# Patient Record
Sex: Male | Born: 1965 | Race: White | Hispanic: No | Marital: Married | Smoking: Current every day smoker
Health system: Southern US, Community
[De-identification: ages and names within clinical notes are randomized; demographics above are authoritative.]

## PROBLEM LIST (undated history)

## (undated) DIAGNOSIS — Z9989 Dependence on other enabling machines and devices: Secondary | ICD-10-CM

## (undated) DIAGNOSIS — G4733 Obstructive sleep apnea (adult) (pediatric): Secondary | ICD-10-CM

## (undated) DIAGNOSIS — K5792 Diverticulitis of intestine, part unspecified, without perforation or abscess without bleeding: Secondary | ICD-10-CM

## (undated) DIAGNOSIS — M2550 Pain in unspecified joint: Secondary | ICD-10-CM

## (undated) DIAGNOSIS — M1990 Unspecified osteoarthritis, unspecified site: Secondary | ICD-10-CM

## (undated) HISTORY — PX: KNEE ARTHROSCOPY: SUR90

## (undated) HISTORY — PX: JOINT REPLACEMENT: SHX530

## (undated) HISTORY — PX: APPENDECTOMY: SHX54

---

## 2006-10-22 ENCOUNTER — Ambulatory Visit: Payer: Self-pay | Admitting: Orthopedic Surgery

## 2011-09-05 ENCOUNTER — Ambulatory Visit (INDEPENDENT_AMBULATORY_CARE_PROVIDER_SITE_OTHER): Payer: BC Managed Care – PPO

## 2011-09-05 ENCOUNTER — Other Ambulatory Visit: Payer: Self-pay | Admitting: Orthopedic Surgery

## 2011-09-05 ENCOUNTER — Encounter: Payer: Self-pay | Admitting: Orthopedic Surgery

## 2011-09-05 ENCOUNTER — Ambulatory Visit (INDEPENDENT_AMBULATORY_CARE_PROVIDER_SITE_OTHER): Payer: BC Managed Care – PPO | Admitting: Orthopedic Surgery

## 2011-09-05 VITALS — Ht 77.0 in | Wt 312.0 lb

## 2011-09-05 DIAGNOSIS — M25469 Effusion, unspecified knee: Secondary | ICD-10-CM

## 2011-09-05 DIAGNOSIS — M25569 Pain in unspecified knee: Secondary | ICD-10-CM

## 2011-09-05 DIAGNOSIS — M23329 Other meniscus derangements, posterior horn of medial meniscus, unspecified knee: Secondary | ICD-10-CM

## 2011-09-05 NOTE — Progress Notes (Signed)
Subjective:    Patient ID: Paul Wright, male    DOB: 1965/12/17, 46 y.o.   MRN: [id number]  Knee Pain  The incident occurred more than 1 week ago (2 weeks ). The incident occurred at home. There was no injury mechanism. The pain is present in the right knee. The quality of the pain is described as aching. The pain is at a severity of 7/10. The pain is moderate. The pain has been constant since onset. Associated symptoms include a loss of motion and muscle weakness. Pertinent negatives include no inability to bear weight, loss of sensation, numbness or tingling.      Review of Systems  Neurological: Negative for tingling and numbness.  All other systems reviewed and are negative.       Objective:   Physical Exam  Constitutional: He is oriented to person, place, and time. He appears well-developed and well-nourished.  Cardiovascular: Intact distal pulses.   Abdominal: He exhibits no distension.  Musculoskeletal:       Right knee: He exhibits effusion.  Lymphadenopathy:    He has no cervical adenopathy.  Neurological: He is alert and oriented to person, place, and time. He has normal reflexes. He displays normal reflexes. He exhibits normal muscle tone. Coordination normal.  Skin: Skin is warm and dry. No rash noted. No erythema. No pallor.  Psychiatric: He has a normal mood and affect. His behavior is normal. Judgment and thought content normal.   Right Knee Exam   Tenderness  The patient is experiencing tenderness in the medial joint line.  Range of Motion  Extension: normal  Flexion: 120   Muscle Strength   The patient has normal right knee strength.  Tests  McMurray:  Medial - positive Lateral - negative Lachman:  Anterior - negative    Posterior - negative Drawer:       Anterior - negative    Posterior - negative Varus: negative Valgus: negative Pivot Shift: negative  Other  Erythema: absent Scars: absent Sensation: normal Pulse: present Swelling:  moderate Other tests: effusion present   Left Knee Exam  Left knee exam is normal.  Tenderness  The patient is experiencing no tenderness.     Range of Motion  The patient has normal left knee ROM.  Tests  McMurray:  Medial - negative Lateral - negative Lachman:  Anterior - negative    Posterior - negative Drawer:       Anterior - negative     Posterior - negative Varus: negative Valgus: negative Pivot Shift: positive  Other  Erythema: absent Scars: present Sensation: normal Pulse: present Swelling: none   Right Hip Exam  Right hip exam is normal.   Range of Motion  The patient has normal right hip ROM.  Muscle Strength  The patient has normal right hip strength.  Other  Scars: absent Sensation: normal Pulse: present   Left Hip Exam  Left hip exam is normal.  Range of Motion  The patient has normal left hip ROM.  Muscle Strength  The patient has normal left hip strength.   Other  Scars: absent Sensation: normal Pulse: present     X-rays of the RIGHT knee show joint effusion And he has symmetric joint space narrowing on each side of the joint       Assessment & Plan:  Medial meniscal tear.  Joint effusion.  Aspiration injection RIGHT knee.  Continue oral anti-inflammatories and ice for 2 weeks if not improved, called the office and we will  schedule an MRI of the RIGHT knee for medial meniscal tear  Aspiration RIGHT knee Verbal consent and timeout were completed for a RIGHT knee aspiration  Under sterile conditions the RIGHT knee was aspirated from a lateral approach.  Findings were clear yellow fluid 50 cc  A sterile dressing was applied there were no complications  Knee  Injection Procedure Note  Pre-operative Diagnosis: right knee oa  Post-operative Diagnosis: same  Indications: pain  Anesthesia: ethyl chloride   Procedure Details   Verbal consent was obtained for the procedure. Time out was completed.The joint was  prepped with alcohol, followed by  Ethyl chloride spray and A 20 gauge needle was inserted into the knee via lateral approach; 4ml 1% lidocaine and 1 ml of depomedrol  was then injected into the joint . The needle was removed and the area cleansed and dressed.  Complications:  None; patient tolerated the procedure well.

## 2011-09-05 NOTE — Patient Instructions (Addendum)
You have received a steroid shot. 15% of patients experience increased pain at the injection site with in the next 24 hours. This is best treated with ice and tylenol extra strength 2 tabs every 8 hours. If you are still having pain please call the office.   Continue ice and diclofenac  Call us in 2 weeks if not better and we will order MRI right knee Knee, Cartilage (Meniscus) Injury It is suspected that you have a torn cartilage (meniscus) in your knee. The menisci are made of tough cartilage, and fit between the surfaces of the thigh and leg bones. The menisci are "C"shaped and have a wedged profile. The wedged profile helps the stability of the joint by keeping the rounded femur surface from sliding off the flat tibial surface. The menisci are fed (nourished) by small blood vessels; but there is also a large area at the inner edge of the meniscus that does not have a good blood supply (avascular). This presents a problem when there is an injury to the meniscus, because areas without good blood supply heal poorly. As a result when there is a torn cartilage in the knee, surgery is often required to fix it. This is usually done with a surgical procedure less invasive than open surgery (arthroscopy). Some times open surgery of the knee is required if there is other damage. PURPOSE OF THE MENISCUS The medial meniscus rests on the medial tibial plateau. The tibia is the large bone in your lower leg (the shin bone). The medial tibial plateau is the upper end of the bone making up the inner part of your knee. The lateral meniscus serves the same purpose and is located on the outside of the knee. The menisci help to distribute your body weight across the knee joint; they act as shock absorbers. Without the meniscus present, the weight of your body would be unevenly applied to the bones in your legs (the femur and tibia). The femur is the large bone in your thigh. This uneven weight distribution would cause  increased wear and tear on the cartilage lining the joint surfaces, leading to early damage (arthritis) of these areas. The presence of the menisci cartilage is necessary for a healthy knee. PURPOSE OF THE KNEE CARTILAGE The knee joint is made up of three bones: the thigh bone (femur), the shin bone (tibia), and the knee cap (patella). The surfaces of these bones at the knee joint are covered with cartilage called articular cartilage. This smooth slippery surface allows the bones to slide against each other without causing bone damage. The meniscus sits between these cartilaginous surfaces of the bones. It distributes the weight evenly in the joints and helps with the stability of the joint (keeps the joint steady). HOME CARE INSTRUCTIONS  Use crutches and external braces as instructed.   Once home an ice pack applied to your injured knee may help with discomfort and keep the swelling down. An ice pack can be used for the first couple of days or as instructed.   Only take over-the-counter or prescription medicines for pain, discomfort, or fever as directed by your caregiver.   Call if you do not have relief of pain with medications or if there is increasing in pain.   Call if your foot becomes cold or blue.   You may resume normal diet and activities as directed.   Make sure to keep your appointment with your follow-up caregiver. This injury may require further evaluation and treatment beyond the temporary  treatment given today.  Document Released: 04/27/2002 Document Revised: 01/24/2011 Document Reviewed: 08/19/2008 [facility] Patient Information 2012 [place], [place].Meniscus Injury of the Knee, Arthroscopy You may have an internal derangement of the knee. This means something is wrong inside the knee. Your caregiver can make a more accurate diagnosis (learning what is wrong) by performing an arthroscopic procedure. Your knee has two layers of cartilage. Articular cartilage covers the bone ends.  It lets your knee bend and move smoothly. Two menisci (thick pads of cartilage that form a rim inside the joint) help absorb shock. They stabilize your knee. Ligaments bind the bones together. They support your knee joint. Muscles move the joint, help support your knee, and take stress off the joint itself.   ABOUT THE PROCEDURE Arthroscopy is a surgical technique. It allows your orthopedic surgeon to diagnose and treat your knee injury with accuracy. The surgeon looks into your knee through a small scope. The scope is like a small (pencil-sized) telescope. Arthroscopy is less invasive than open knee surgery. You can expect a more rapid recovery. Following your caregiver's instructions will help you recover rapidly and completely. Use crutches, rest, elevate, ice, and do knee exercises as instructed. The length of recovery depends on various factors. These factors include type of injury, age, physical condition, medical conditions, and your determination. How long you will be away from your normal activities will depend on what kind of knee problem you have. It will also depend on how much tissue is damaged. Rebuilding your muscles after arthroscopy helps ensure a full recovery. RECOVERY Recovery after a meniscus injury depends on how much meniscus is damaged. It also depends on whether or not you have damaged other knee tissue. With small tears, your recovery may take a couple weeks. Larger tears will take longer. Meniscus injuries can usually be treated during arthroscopy. If your injury is on the inner edge of the meniscus, your surgeon may trim the meniscus back to a smooth rim. In other cases, your surgeon will try to repair a damaged meniscus with sutures (stitches). This may lengthen your rehabilitation. It may provide better long-term health by helping your knee retain its shock absorption abilities. Use crutches, limit weight bearing, rest, elevate, apply ice, and exercise your knee as instructed. If a  brace is applied, use as directed. The length of recovery depends on various factors including type of injury, age, physical condition, other medical conditions, and your determination. Your caregiver will help with instructions for rehabilitation of your knee. HOME CARE INSTRUCTIONS  Use crutches and knee exercises as instructed.   Applying an ice pack to your operative site may help with discomfort. It may also keep the swelling down.   Only take over-the-counter or prescription medicines for pain, discomfort, inflammation (soreness)or fever as directed by your caregiver. You may use these only if your caregiver has not given medications that would interfere.   You may resume normal diet and activities as directed.  SEEK MEDICAL ATTENTION IF:  There is increased bleeding (more than a small spot) from the wound.   You notice redness, swelling, or increasing pain in the wound.   Pus is coming from wound.   An unexplained oral temperature above 102 F (38.9 C) develops, or as your caregiver suggests.   You notice a foul smell coming from the wound or dressing.  SEEK IMMEDIATE MEDICAL CARE IF:  You develop a rash.   You have difficulty breathing.   You have any allergic problems.  Document Released: 02/02/2000  Document Revised: 01/24/2011 Document Reviewed: 04/20/2007 [facility] Patient Information 2012 [place], [place].

## 2011-11-18 ENCOUNTER — Telehealth: Payer: Self-pay | Admitting: Orthopedic Surgery

## 2011-11-18 NOTE — Telephone Encounter (Signed)
Patient's wife Selena Batten called to request a follow up appointment for right knee fluid, states increasing, as per previous office visit and treatment 09/05/11.  Office note states that if not resolved, patient to call, and to have MRI.  Please advise.  Patient's phone # [phone] [facility]).  I left a message for patient at cell # to relay this information, and for him to call us so that we may re-explain about MRI first, before we schedule another office appointment.

## 2011-11-18 NOTE — Telephone Encounter (Signed)
Per nurse, as per Dr. Romeo Apple, okay to schedule appointment in office.  Done, patient aware.

## 2011-11-20 ENCOUNTER — Encounter: Payer: Self-pay | Admitting: Orthopedic Surgery

## 2011-11-20 ENCOUNTER — Ambulatory Visit (INDEPENDENT_AMBULATORY_CARE_PROVIDER_SITE_OTHER): Payer: BC Managed Care – PPO | Admitting: Orthopedic Surgery

## 2011-11-20 VITALS — BP 102/70 | Ht 77.0 in | Wt 282.0 lb

## 2011-11-20 DIAGNOSIS — M25469 Effusion, unspecified knee: Secondary | ICD-10-CM

## 2011-11-20 DIAGNOSIS — M23329 Other meniscus derangements, posterior horn of medial meniscus, unspecified knee: Secondary | ICD-10-CM

## 2011-11-20 NOTE — Patient Instructions (Addendum)
You have received a steroid shot. 15% of patients experience increased pain at the injection site with in the next 24 hours. This is best treated with ice and tylenol extra strength 2 tabs every 8 hours. If you are still having pain please call the office.  Knee Effusion The medical term for having fluid in your knee is effusion. This is often due to an internal derangement of the knee. This means something is wrong inside the knee. Some of the causes of fluid in the knee may be torn cartilage, a torn ligament, or bleeding into the joint from an injury. Your knee is likely more difficult to bend and move. This is often because there is increased pain and pressure in the joint. The time it takes for recovery from a knee effusion depends on different factors, including:    Type of injury.   Your age.   Physical and medical conditions.   Rehabilitation Strategies.  How long you will be away from your normal activities will depend on what kind of knee problem you have and how much damage is present. Your knee has two types of cartilage. Articular cartilage covers the bone ends and lets your knee bend and move smoothly. Two menisci, thick pads of cartilage that form a rim inside the joint, help absorb shock and stabilize your knee. Ligaments bind the bones together and support your knee joint. Muscles move the joint, help support your knee, and take stress off the joint itself. CAUSES   Often an effusion in the knee is caused by an injury to one of the menisci. This is often a tear in the cartilage. Recovery after a meniscus injury depends on how much meniscus is damaged and whether you have damaged other knee tissue. Small tears may heal on their own with conservative treatment. Conservative means rest, limited weight bearing activity and muscle strengthening exercises. Your recovery may take up to 6 weeks.   TREATMENT   Larger tears may require surgery. Meniscus injuries may be treated during  arthroscopy. Arthroscopy is a procedure in which your surgeon uses a small telescope like instrument to look in your knee. Your caregiver can make a more accurate diagnosis (learning what is wrong) by performing an arthroscopic procedure. If your injury is on the inner margin of the meniscus, your surgeon may trim the meniscus back to a smooth rim. In other cases your surgeon will try to repair a damaged meniscus with stitches (sutures). This may make rehabilitation take longer, but may provide better long term result by helping your knee keep its shock absorption capabilities. Ligaments which are completely torn usually require surgery for repair. HOME CARE INSTRUCTIONS  Use crutches as instructed.   If a brace is applied, use as directed.   Once you are home, an ice pack applied to your swollen knee may help with discomfort and help decrease swelling.   Keep your knee raised (elevated) when you are not up and around or on crutches.   Only take over-the-counter or prescription medicines for pain, discomfort, or fever as directed by your caregiver.   Your caregivers will help with instructions for rehabilitation of your knee. This often includes strengthening exercises.   You may resume a normal diet and activities as directed.  SEEK MEDICAL CARE IF:    There is increased swelling in your knee.   You notice redness, swelling, or increasing pain in your knee.   An unexplained oral temperature above 102 F (38.9 C) develops.  SEEK  IMMEDIATE MEDICAL CARE IF:    You develop a rash.   You have difficulty breathing.   You have any allergic reactions from medications you may have been given.   There is severe pain with any motion of the knee.  MAKE SURE YOU:    Understand these instructions.   Will watch your condition.   Will get help right away if you are not doing well or get worse.  Document Released: 04/27/2003 Document Revised: 04/29/2011 Document Reviewed:  07/01/2007 [facility] Patient Information 2013 [place], [place].

## 2011-11-20 NOTE — Progress Notes (Signed)
Patient ID: Paul Wright, male   DOB: May 17, 1965, 46 y.o.   MRN: [id number] Previous diagnosis and treatment  Medial meniscal tear.  Joint effusion.  Aspiration injection RIGHT knee.  Continue oral anti-inflammatories and ice for 2 weeks if not improved, called the office and we will schedule an MRI of the RIGHT knee for medial meniscal tear  Chief Complaint  Patient presents with  . Knee Problem    reoccuring right knee swelling    Review of systems no catching locking or giving way but recurrent fluid  Ambulation is with a slight limp swollen joint flexion ARC 125 ligaments are stable strength is normal scans intact pulses good   assessment established problem worse Joint effusion possible meniscus tear  Aspirate inject aspirated 50 cc of yellow fluid  Continue ice I recommend arthroscopic surgery when the patient can be off of work for 4 weeks because his job is intense in terms of stress on the knee he will think about it  Aspiration RIGHT knee Verbal consent and timeout were completed for a RIGHT knee aspiration  Under sterile conditions the RIGHT knee was aspirated from a lateral approach. Followed by injection of 40 mg of Depo-Medrol with 3 cc 1% lidocaine  Findings were clear yellow fluid 50 cc  A sterile dressing was applied there were no complications

## 2012-08-27 ENCOUNTER — Encounter: Payer: Self-pay | Admitting: Orthopedic Surgery

## 2012-08-27 ENCOUNTER — Ambulatory Visit (INDEPENDENT_AMBULATORY_CARE_PROVIDER_SITE_OTHER): Payer: BC Managed Care – PPO

## 2012-08-27 ENCOUNTER — Ambulatory Visit (INDEPENDENT_AMBULATORY_CARE_PROVIDER_SITE_OTHER): Payer: BC Managed Care – PPO | Admitting: Orthopedic Surgery

## 2012-08-27 VITALS — BP 121/86 | Ht 77.0 in | Wt 279.0 lb

## 2012-08-27 DIAGNOSIS — M1710 Unilateral primary osteoarthritis, unspecified knee: Secondary | ICD-10-CM

## 2012-08-27 DIAGNOSIS — M25562 Pain in left knee: Secondary | ICD-10-CM

## 2012-08-27 DIAGNOSIS — M25569 Pain in unspecified knee: Secondary | ICD-10-CM

## 2012-08-27 DIAGNOSIS — M179 Osteoarthritis of knee, unspecified: Secondary | ICD-10-CM | POA: Insufficient documentation

## 2012-08-27 MED ORDER — NABUMETONE 500 MG PO TABS: 500.0000 mg | ORAL_TABLET | Freq: Two times a day (BID) | ORAL | Status: DC

## 2012-08-27 NOTE — Patient Instructions (Addendum)
Pick up medication from Pharmacy

## 2012-08-27 NOTE — Progress Notes (Signed)
Patient ID: Paul Wright, male   DOB: 1965/05/24, 47 y.o.   MRN: [id number] Chief complaint pain left knee medial side for 55 months  47 year old male with history of osteophytes knee effusion right knee presents with pain in his left knee over the medial joint line with catching locking swelling. Sharp dull pain reaches 8-9/10 and has become constant. 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  Review of systems negative except for snoring and joint pain with swelling stiffness muscle pain as described. All other systems reviewed and negative. Past Medical History  Diagnosis Date  . Sleep apnea    Past Surgical History  Procedure Laterality Date  . Knee arthroscopy     Family History  Problem Relation Age of Onset  . Arthritis      BP 121/86  Ht 6\' 5"  (1.956 m)  Wt 279 lb (126.554 kg)  BMI 33.08 kg/m2 General appearance is normal, the patient is alert and oriented x3 with normal mood and affect. He is walking just fine no assistive device no brace is. Left knee joint effusion mild trace, flexion 130 stability tests were normal strength was normal skin was normal pulse was normal sensory exam normal  X-ray medial joint space narrowing osteoarthritis  Impression left knee pain with osteoarthritis  Inject left knee start Relafen twice a day followup 6 months  Knee  Injection Procedure Note  Pre-operative Diagnosis: left knee oa  Post-operative Diagnosis: same  Indications: pain  Anesthesia: ethyl chloride   Procedure Details   Verbal consent was obtained for the procedure. Time out was completed.The joint was prepped with alcohol, followed by  Ethyl chloride spray and A 20 gauge needle was inserted into the knee via lateral approach; 4ml 1% lidocaine and 1 ml of depomedrol  was then injected into the joint . The needle was removed and the area cleansed and dressed.  Complications:  None; patient tolerated the procedure well.

## 2013-03-02 ENCOUNTER — Ambulatory Visit (INDEPENDENT_AMBULATORY_CARE_PROVIDER_SITE_OTHER): Payer: BC Managed Care – PPO | Admitting: Orthopedic Surgery

## 2013-03-02 ENCOUNTER — Encounter: Payer: Self-pay | Admitting: Orthopedic Surgery

## 2013-03-02 VITALS — BP 132/68 | Ht 77.0 in | Wt 279.0 lb

## 2013-03-02 DIAGNOSIS — IMO0002 Reserved for concepts with insufficient information to code with codable children: Secondary | ICD-10-CM

## 2013-03-02 DIAGNOSIS — M1710 Unilateral primary osteoarthritis, unspecified knee: Secondary | ICD-10-CM

## 2013-03-02 DIAGNOSIS — M179 Osteoarthritis of knee, unspecified: Secondary | ICD-10-CM

## 2013-03-02 DIAGNOSIS — M25569 Pain in unspecified knee: Secondary | ICD-10-CM

## 2013-03-02 DIAGNOSIS — M25562 Pain in left knee: Secondary | ICD-10-CM

## 2013-03-02 NOTE — Patient Instructions (Signed)
You have received a steroid shot. 15% of patients experience increased pain at the injection site with in the next 24 hours. This is best treated with ice and tylenol extra strength 2 tabs every 8 hours. If you are still having pain please call the office.    

## 2013-03-02 NOTE — Progress Notes (Signed)
Patient ID: Paul Wright, male   DOB: 1965-08-23, 48 y.o.   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 He had an injection last time  Is not having any catching or locking or giving way just medial knee pain with some swelling intermittently depending on his activities  The knee has trace effusion he is maintained his motion his knee is stable strength is normal scans intact good pulse normal sensation ambulating without labor gait.  Impression osteoarthritis left knee  Repeat injection  Followup 6 months Knee  Injection Procedure Note  Pre-operative Diagnosis: left knee oa  Post-operative Diagnosis: same  Indications: pain  Anesthesia: ethyl chloride   Procedure Details   Verbal consent was obtained for the procedure. Time out was completed.The joint was prepped with alcohol, followed by  Ethyl chloride spray and A 20 gauge needle was inserted into the knee via lateral approach; 4ml 1% lidocaine and 1 ml of depomedrol  was then injected into the joint . The needle was removed and the area cleansed and dressed.  Complications:  None; patient tolerated the procedure well.

## 2013-08-31 ENCOUNTER — Ambulatory Visit: Payer: BC Managed Care – PPO | Admitting: Orthopedic Surgery

## 2013-09-23 ENCOUNTER — Ambulatory Visit (INDEPENDENT_AMBULATORY_CARE_PROVIDER_SITE_OTHER): Payer: BC Managed Care – PPO | Admitting: Orthopedic Surgery

## 2013-09-23 VITALS — BP 131/84 | Ht 77.0 in | Wt 279.0 lb

## 2013-09-23 DIAGNOSIS — M1710 Unilateral primary osteoarthritis, unspecified knee: Secondary | ICD-10-CM

## 2013-09-23 DIAGNOSIS — M25562 Pain in left knee: Secondary | ICD-10-CM

## 2013-09-23 DIAGNOSIS — M1712 Unilateral primary osteoarthritis, left knee: Secondary | ICD-10-CM

## 2013-09-23 DIAGNOSIS — M25569 Pain in unspecified knee: Secondary | ICD-10-CM

## 2013-09-23 MED ORDER — NABUMETONE 500 MG PO TABS
500.0000 mg | ORAL_TABLET | Freq: Two times a day (BID) | ORAL | Status: DC
Start: 2013-09-23 — End: 2016-06-18

## 2013-09-23 NOTE — Patient Instructions (Signed)
Joint Injection  Care After  Refer to this sheet in the next few days. These instructions provide you with information on caring for yourself after you have had a joint injection. Your caregiver also may give you more specific instructions. Your treatment has been planned according to current medical practices, but problems sometimes occur. Call your caregiver if you have any problems or questions after your procedure.  After any type of joint injection, it is not uncommon to experience:  · Soreness, swelling, or bruising around the injection site.  · Mild numbness, tingling, or weakness around the injection site caused by the numbing medicine used before or with the injection.  It also is possible to experience the following effects associated with the specific agent after injection:  · Iodine-based contrast agents:  ¨ Allergic reaction (itching, hives, widespread redness, and swelling beyond the injection site).  · Corticosteroids (These effects are rare.):  ¨ Allergic reaction.  ¨ Increased blood sugar levels (If you have diabetes and you notice that your blood sugar levels have increased, notify your caregiver).  ¨ Increased blood pressure levels.  ¨ Mood swings.  · Hyaluronic acid in the use of viscosupplementation.  ¨ Temporary heat or redness.  ¨ Temporary rash and itching.  ¨ Increased fluid accumulation in the injected joint.  These effects all should resolve within a day after your procedure.   HOME CARE INSTRUCTIONS  · Limit yourself to light activity the day of your procedure. Avoid lifting heavy objects, bending, stooping, or twisting.  · Take prescription or over-the-counter pain medication as directed by your caregiver.  · You may apply ice to your injection site to reduce pain and swelling the day of your procedure. Ice may be applied 03-04 times:  ¨ Put ice in a plastic bag.  ¨ Place a towel between your skin and the bag.  ¨ Leave the ice on for no longer than 15-20 minutes each time.  SEEK  IMMEDIATE MEDICAL CARE IF:   · Pain and swelling get worse rather than better or extend beyond the injection site.  · Numbness does not go away.  · Blood or fluid continues to leak from the injection site.  · You have chest pain.  · You have swelling of your face or tongue.  · You have trouble breathing or you become dizzy.  · You develop a fever, chills, or severe tenderness at the injection site that last longer than 1 day.  MAKE SURE YOU:  · Understand these instructions.  · Watch your condition.  · Get help right away if you are not doing well or if you get worse.  Document Released: 10/18/2010 Document Revised: 04/29/2011 Document Reviewed: 10/18/2010  [employer]® Patient Information ©2015 [employer]. This information is not intended to replace advice given to you by your health care provider. Make sure you discuss any questions you have with your health care provider.

## 2013-09-23 NOTE — Progress Notes (Signed)
Chief Complaint  Patient presents with  . Follow-up    recheck left knee    6 followup left knee. Patient claims of pain mild swelling some stiffness no catching locking or giving way  Review of systems negative  General appearance is normal, the patient is alert and oriented x3 with normal mood and affect. BP 131/84  Ht 6\' 5"  (1.956 m)  Wt 279 lb (126.554 kg)  BMI 33.08 kg/m2 Neurovascular exam remains intact he has medial joint line tenderness no effusion full range of motion and is stable  Repeat injection continue nabumetone followup 6 months  Knee  Injection Procedure Note  Pre-operative Diagnosis: left knee oa  Post-operative Diagnosis: same  Indications: pain  Anesthesia: ethyl chloride   Procedure Details   Verbal consent was obtained for the procedure. Time out was completed.The joint was prepped with alcohol, followed by  Ethyl chloride spray and A 20 gauge needle was inserted into the knee via lateral approach; 4ml 1% lidocaine and 1 ml of depomedrol  was then injected into the joint . The needle was removed and the area cleansed and dressed.  Complications:  None; patient tolerated the procedure well.

## 2013-11-12 ENCOUNTER — Encounter: Payer: Self-pay | Admitting: Family Medicine

## 2013-11-12 ENCOUNTER — Encounter (INDEPENDENT_AMBULATORY_CARE_PROVIDER_SITE_OTHER): Payer: Self-pay

## 2013-11-12 ENCOUNTER — Ambulatory Visit (INDEPENDENT_AMBULATORY_CARE_PROVIDER_SITE_OTHER): Payer: BC Managed Care – PPO | Admitting: Family Medicine

## 2013-11-12 VITALS — BP 115/62 | HR 70 | Temp 98.6°F | Ht 77.0 in | Wt 284.0 lb

## 2013-11-12 DIAGNOSIS — Z0000 Encounter for general adult medical examination without abnormal findings: Secondary | ICD-10-CM

## 2013-11-12 DIAGNOSIS — G4733 Obstructive sleep apnea (adult) (pediatric): Secondary | ICD-10-CM

## 2013-11-12 DIAGNOSIS — G609 Hereditary and idiopathic neuropathy, unspecified: Secondary | ICD-10-CM | POA: Insufficient documentation

## 2013-11-12 MED ORDER — SILDENAFIL CITRATE 20 MG PO TABS: 20.0000 mg | ORAL_TABLET | Freq: Three times a day (TID) | ORAL | Status: DC

## 2013-11-12 MED ORDER — GABAPENTIN 300 MG PO CAPS: ORAL_CAPSULE | ORAL | Status: DC

## 2013-11-12 NOTE — Progress Notes (Signed)
   Subjective:    Patient ID: Paul Wright, male    DOB: August 31, 1965, 48 y.o.   MRN: [id number]  HPI  47 yr old  With several as of yet undiagnosed problems, but which seem to be lurking beneath the surface.  Smoker with very positive FH of early heart disease. Has knee problems, followed by ortho.  Sleep apnea but unable to wear CPAP.  Burning and numbness in feet.  Polyuria and -dipsia.  Erectile dysfunction; had tried testosterone without much success.    Review of Systems  Constitutional: Negative.   HENT: Negative.   Eyes: Negative.   Respiratory: Negative.   Cardiovascular: Negative.   Gastrointestinal: Negative.   Endocrine: Positive for polydipsia and polyphagia.  Genitourinary:       Erectile dysfunction  Musculoskeletal: Positive for arthralgias.  Skin: Negative.   Neurological: Negative.   Hematological: Negative.   Psychiatric/Behavioral: Negative.        Objective:   Physical Exam  Constitutional: He is oriented to person, place, and time. He appears well-developed and well-nourished.  HENT:  Head: Normocephalic.  Right Ear: External ear normal.  Left Ear: External ear normal.  Nose: Nose normal.  Mouth/Throat: Oropharynx is clear and moist.  Eyes: Conjunctivae and EOM are normal. Pupils are equal, round, and reactive to light.  Neck: Normal range of motion. Neck supple.  Cardiovascular: Normal rate, regular rhythm, normal heart sounds and intact distal pulses.   Pulmonary/Chest: Effort normal and breath sounds normal.  Abdominal: Soft. Bowel sounds are normal.  Musculoskeletal: Normal range of motion.  Neurological: He is alert and oriented to person, place, and time.  Skin: Skin is warm and dry.  Psychiatric: He has a normal mood and affect. His behavior is normal. Judgment and thought content normal.    BP 115/62  Pulse 70  Temp(Src) 98.6 F (37 C) (Oral)  Ht  (1.956 m)  Wt 284 lb (128.822 kg)  BMI 33.67 kg/m2      Assessment & Plan:  1.  Unspecified hereditary and idiopathic peripheral neuropathy Suspect may have undiagnosed DM  Will begin neurontin  2. Obstructive sleep apnea To get back to respiratory therapy to try nasal pillow  3.Polyuria and dipsia Labs to R/O DM  4.family history early heart disease Check lipids and encourage smoking cessation  5.Erectile dysfunction Trial of Viagra  Frederica Kuster MD

## 2013-11-12 NOTE — Progress Notes (Signed)
   Subjective:    Patient ID: Paul Wright, male    DOB: 1965/10/29, 48 y.o.   MRN: [id number]  HPI    Review of Systems     Objective:   Physical Exam  BP 115/62  Pulse 70  Temp(Src) 98.6 F (37 C) (Oral)  Ht  (1.956 m)  Wt 284 lb (128.822 kg)  BMI 33.67 kg/m2      Assessment & Plan:

## 2013-11-19 ENCOUNTER — Other Ambulatory Visit: Payer: BC Managed Care – PPO

## 2013-11-20 LAB — LIPID PANEL
CHOLESTEROL TOTAL: 155 mg/dL (ref 100–199)
Chol/HDL Ratio: 7.8 ratio units — ABNORMAL HIGH (ref 0.0–5.0)
HDL: 20 mg/dL — ABNORMAL LOW (ref >39)
LDL CALC: 58 mg/dL (ref 0–99)
Triglycerides: 384 mg/dL — ABNORMAL HIGH (ref 0–149)
VLDL Cholesterol Cal: 77 mg/dL — ABNORMAL HIGH (ref 5–40)

## 2013-11-20 LAB — CMP14+EGFR
ALK PHOS: 90 IU/L (ref 39–117)
ALT: 32 IU/L (ref 0–44)
AST: 25 IU/L (ref 0–40)
Albumin/Globulin Ratio: 2.1 (ref 1.1–2.5)
Albumin: 4.6 g/dL (ref 3.5–5.5)
BILIRUBIN TOTAL: 0.6 mg/dL (ref 0.0–1.2)
BUN/Creatinine Ratio: 14 (ref 9–20)
BUN: 11 mg/dL (ref 6–24)
CALCIUM: 9.2 mg/dL (ref 8.7–10.2)
CO2: 24 mmol/L (ref 18–29)
CREATININE: 0.81 mg/dL (ref 0.76–1.27)
Chloride: 102 mmol/L (ref 97–108)
GFR calc Af Amer: 121 mL/min/{1.73_m2} (ref >59)
GFR, EST NON AFRICAN AMERICAN: 105 mL/min/{1.73_m2} (ref >59)
GLUCOSE: 89 mg/dL (ref 65–99)
Globulin, Total: 2.2 g/dL (ref 1.5–4.5)
Potassium: 4.4 mmol/L (ref 3.5–5.2)
SODIUM: 140 mmol/L (ref 134–144)
Total Protein: 6.8 g/dL (ref 6.0–8.5)

## 2013-12-03 ENCOUNTER — Telehealth: Payer: Self-pay | Admitting: Family Medicine

## 2013-12-03 NOTE — Telephone Encounter (Signed)
Message copied by Doreatha MassedMOORE, MITZI on Fri Dec 03, 2013 12:34 PM ------      Message from: Frederica KusterMILLER, STEPHEN M      Created: Wed Nov 24, 2013  7:45 AM       Review of lab results showed normal chemistry panel; there is no evidence of diabetes and lipid panel isn't surprisingly pretty good. His total cholesterol is normal but his triglycerides and HDL, good cholesterol is low. However his LDL cholesterol the most important one is normal. ------

## 2013-12-09 ENCOUNTER — Encounter: Payer: Self-pay | Admitting: *Deleted

## 2013-12-13 NOTE — Telephone Encounter (Signed)
Patient aware.

## 2014-03-29 ENCOUNTER — Ambulatory Visit: Payer: Self-pay | Admitting: Orthopedic Surgery

## 2014-03-29 ENCOUNTER — Encounter: Payer: Self-pay | Admitting: Orthopedic Surgery

## 2014-05-09 ENCOUNTER — Ambulatory Visit (INDEPENDENT_AMBULATORY_CARE_PROVIDER_SITE_OTHER): Payer: BLUE CROSS/BLUE SHIELD | Admitting: Orthopedic Surgery

## 2014-05-09 ENCOUNTER — Encounter: Payer: Self-pay | Admitting: Orthopedic Surgery

## 2014-05-09 ENCOUNTER — Ambulatory Visit (INDEPENDENT_AMBULATORY_CARE_PROVIDER_SITE_OTHER): Payer: BLUE CROSS/BLUE SHIELD

## 2014-05-09 VITALS — Resp 16 | Ht 77.0 in | Wt 309.0 lb

## 2014-05-09 DIAGNOSIS — M7552 Bursitis of left shoulder: Secondary | ICD-10-CM | POA: Diagnosis not present

## 2014-05-09 DIAGNOSIS — M7711 Lateral epicondylitis, right elbow: Secondary | ICD-10-CM

## 2014-05-09 DIAGNOSIS — M25512 Pain in left shoulder: Secondary | ICD-10-CM

## 2014-05-09 MED ORDER — TRAMADOL-ACETAMINOPHEN 37.5-325 MG PO TABS: 1.0000 | ORAL_TABLET | ORAL | Status: DC | PRN

## 2014-05-09 NOTE — Progress Notes (Signed)
Patient ID: Paul Wright, male   DOB: 1965-06-19, 50 y.o.   MRN: [id number]  Chief Complaint  Patient presents with  . Shoulder Pain    left shoulder pain, no known injury     Paul Wright is a 49 y.o. male.   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 He gives a history of  Shoulder dislocation of the child and then about 12 years ago had a shoulder injection for what sounds like impingement syndrome Review of Systems Stiff joints muscle weakness limb pain.  All other systems were reviewed and are normal  Past Medical History  Diagnosis Date  . Sleep apnea   . Knee pain     Past Surgical History  Procedure Laterality Date  . Knee arthroscopy      Family History  Problem Relation Age of Onset  . Arthritis    . Heart disease Mother   . Diabetes Mother   . Heart attack Father   . Graves' disease Sister   . Hypothyroidism Brother   . Hypertension Maternal Aunt   . Diabetes Sister   . Heart disease Sister   . Heart disease Sister   . Heart disease Brother   . Diabetes Brother     Social History History  Substance Use Topics  . Smoking status: Current Every Day Smoker -- 0.75 packs/day  . Smokeless tobacco: Never Used  . Alcohol Use: Yes     Comment: rare    No Known Allergies  Current Outpatient Prescriptions  Medication Sig Dispense Refill  . nabumetone (RELAFEN) 500 MG tablet Take 1 tablet (500 mg total) by mouth 2 (two) times daily. 60 tablet 5  . traMADol-acetaminophen (ULTRACET) 37.5-325 MG per tablet Take 1 tablet by mouth every 4 (four) hours as needed. 60 tablet 0   No  current facility-administered medications for this visit.       Physical Exam Resp. rate 16, height  (1.956 m), weight 309 lb (140.161 kg). Physical Exam The patient is well developed well nourished and well groomed. Orientation to person place and time is normal  Mood is pleasant. Ambulatory status no abnormalities noted on his walking pattern Lymphatics in the right elbow and left axilla and supraclavicular region were normal. Tenderness was noted over the right elbow but range of motion was preserved, ligaments were stable and muscle tone was normal skin not show any rashes scars or lesions sensation was normal  Left shoulder he had decreased external rotation 30 versus 50 on his right shoulder. He had painful [place] elevation after 90. He had a negative drop test intact supraspinatus strength is grade 5 over 5 manual muscle tests, instability tests were normal. Impingement sign was positive.  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 I think he has an acute bursitis with an underlying glenohumeral joint which is deteriorating. Subacromial injection for the bursitis on the left shoulder Plan Subacromial injection left shoulder for bursitis, tennis elbow  brace for the right elbow  6 week follow-up  Recommend Ultracet for pain

## 2014-05-09 NOTE — Progress Notes (Signed)
Procedure note the subacromial injection shoulder left   Verbal consent was obtained to inject the  Left   Shoulder  Timeout was completed to confirm the injection site is a subacromial space of the  left  shoulder  Medication used Depo-Medrol 40 mg and lidocaine 1% 3 cc  Anesthesia was provided by ethyl chloride  The injection was performed in the left  posterior subacromial space. After pinning the skin with alcohol and anesthetized the skin with ethyl chloride the subacromial space was injected using a 20-gauge needle. There were no complications  Sterile dressing was applied.          

## 2014-06-21 ENCOUNTER — Encounter: Payer: Self-pay | Admitting: Orthopedic Surgery

## 2014-06-21 ENCOUNTER — Ambulatory Visit (INDEPENDENT_AMBULATORY_CARE_PROVIDER_SITE_OTHER): Payer: 59 | Admitting: Orthopedic Surgery

## 2014-06-21 VITALS — BP 138/89 | Ht 77.0 in | Wt 309.0 lb

## 2014-06-21 DIAGNOSIS — M25512 Pain in left shoulder: Secondary | ICD-10-CM | POA: Diagnosis not present

## 2014-06-21 DIAGNOSIS — M75102 Unspecified rotator cuff tear or rupture of left shoulder, not specified as traumatic: Secondary | ICD-10-CM

## 2014-06-21 DIAGNOSIS — M7552 Bursitis of left shoulder: Secondary | ICD-10-CM | POA: Diagnosis not present

## 2014-06-21 DIAGNOSIS — M129 Arthropathy, unspecified: Secondary | ICD-10-CM | POA: Diagnosis not present

## 2014-06-21 DIAGNOSIS — M7711 Lateral epicondylitis, right elbow: Secondary | ICD-10-CM | POA: Diagnosis not present

## 2014-06-21 DIAGNOSIS — M19019 Primary osteoarthritis, unspecified shoulder: Secondary | ICD-10-CM

## 2014-06-21 MED ORDER — HYDROCODONE-ACETAMINOPHEN 5-325 MG PO TABS: 1.0000 | ORAL_TABLET | Freq: Four times a day (QID) | ORAL | Status: DC | PRN

## 2014-06-21 NOTE — Progress Notes (Signed)
Patient ID: Paul PatrickKenneth D Mand, male   DOB: Dec 28, 1965, 49 y.o.   MRN: [id number] F/u established problem worse  [age] year old male presents with persistent left shoulder pain cyst bit injection in the subacromial space. 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 We got x-rays and it showed grade 4 arthritis of his glenohumeral joint. We thought maybe he had an acute case of bursitis with an underlying glenohumeral arthritis and treated him with injection and tramadol and he did not get better he says the shoulder is worse. He can't sleep at night he can't roll over onto his left side. He can't pick his arm up above his head. He's lost external rotation.  Review of systems we also treated him for right tennis elbow. He wore tennis elbow brace. He still complaining of elbow pain as well.  His left shoulder and right arm are asymptomatic for neurologic symptoms.  Today his left shoulder is more remarkable for 30 external rotation painful elevation to 90. It is difficult to assess his cuff but in the adductor position his internal and external rotation strength is normal. You can hold his arm above his head and his drop test is normal.  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 I'm going to have him referred [facility] Dr. Dorene GrebeScott Dean is a shoulder specialist there. We put him on Norco 5 mg every 6 when necessary for pain  I will call him after his result and we will make the referral as soon as we can.  Note right elbow tenderness lateral epicondyle normal range of motion and stability motor exam normal neurovascular exam intact epitrochlear lymph nodes are negative  I injected that  Procedure  note injection for tennis elbow right   Diagnosis tennis elbow right    Anesthesia ethyl chloride was used Alcohol use is clean the skin  After we obtained verbal consent and timeout a 25-gauge needle was used to inject 40 mg of Depo-Medrol and 3 cc of 1% lidocaine.  There were no complications and a sterile bandage was applied.

## 2014-06-21 NOTE — Patient Instructions (Signed)
We will schedule MRI for you and refer you to [facility] Dr August Saucerean regarding shoulder replacement

## 2014-07-06 ENCOUNTER — Ambulatory Visit (HOSPITAL_COMMUNITY)
Admission: RE | Admit: 2014-07-06 | Discharge: 2014-07-06 | Disposition: A | Discharge status: Home / Self Care | Payer: 59 | Source: Ambulatory Visit | Attending: Orthopedic Surgery | Admitting: Orthopedic Surgery

## 2014-07-06 DIAGNOSIS — M75102 Unspecified rotator cuff tear or rupture of left shoulder, not specified as traumatic: Secondary | ICD-10-CM | POA: Diagnosis present

## 2014-07-13 ENCOUNTER — Telehealth: Payer: Self-pay | Admitting: *Deleted

## 2014-07-13 ENCOUNTER — Other Ambulatory Visit: Payer: Self-pay | Admitting: *Deleted

## 2014-07-13 DIAGNOSIS — M19012 Primary osteoarthritis, left shoulder: Secondary | ICD-10-CM

## 2014-07-13 NOTE — Telephone Encounter (Signed)
REFERRAL FAXED TO [facility] FOR DR Ave FilterHANDLER

## 2014-08-24 NOTE — Telephone Encounter (Signed)
PATIENT HAD APPT 6/22 3:45 WITH DR Ave FilterHANDLER

## 2014-09-05 ENCOUNTER — Encounter: Payer: Self-pay | Admitting: Physician Assistant

## 2014-09-05 ENCOUNTER — Ambulatory Visit (INDEPENDENT_AMBULATORY_CARE_PROVIDER_SITE_OTHER): Payer: 59 | Admitting: Physician Assistant

## 2014-09-05 VITALS — BP 124/80 | HR 103 | Temp 99.1°F | Ht 77.0 in | Wt 290.6 lb

## 2014-09-05 DIAGNOSIS — J069 Acute upper respiratory infection, unspecified: Secondary | ICD-10-CM

## 2014-09-05 MED ORDER — AMOXICILLIN-POT CLAVULANATE 875-125 MG PO TABS: 1.0000 | ORAL_TABLET | Freq: Two times a day (BID) | ORAL | Status: DC

## 2014-09-05 NOTE — Patient Instructions (Signed)
Upper Respiratory Infection, Adult An upper respiratory infection (URI) is also sometimes known as the common cold. The upper respiratory tract includes the nose, sinuses, throat, trachea, and bronchi. Bronchi are the airways leading to the lungs. Most people improve within 1 week, but symptoms can last up to 2 weeks. A residual cough may last even longer.  CAUSES Many different viruses can infect the tissues lining the upper respiratory tract. The tissues become irritated and inflamed and often become very moist. Mucus production is also common. A cold is contagious. You can easily spread the virus to others by oral contact. This includes kissing, sharing a glass, coughing, or sneezing. Touching your mouth or nose and then touching a surface, which is then touched by another person, can also spread the virus. SYMPTOMS  Symptoms typically develop 1 to 3 days after you come in contact with a cold virus. Symptoms vary from person to person. They may include:  Runny nose.  Sneezing.  Nasal congestion.  Sinus irritation.  Sore throat.  Loss of voice (laryngitis).  Cough.  Fatigue.  Muscle aches.  Loss of appetite.  Headache.  Low-grade fever. DIAGNOSIS  You might diagnose your own cold based on familiar symptoms, since most people get a cold 2 to 3 times a year. Your caregiver can confirm this based on your exam. Most importantly, your caregiver can check that your symptoms are not due to another disease such as strep throat, sinusitis, pneumonia, asthma, or epiglottitis. Blood tests, throat tests, and X-rays are not necessary to diagnose a common cold, but they may sometimes be helpful in excluding other more serious diseases. Your caregiver will decide if any further tests are required. RISKS AND COMPLICATIONS  You may be at risk for a more severe case of the common cold if you smoke cigarettes, have chronic heart disease (such as heart failure) or lung disease (such as asthma), or if  you have a weakened immune system. The very young and very old are also at risk for more serious infections. Bacterial sinusitis, middle ear infections, and bacterial pneumonia can complicate the common cold. The common cold can worsen asthma and chronic obstructive pulmonary disease (COPD). Sometimes, these complications can require emergency medical care and may be life-threatening. PREVENTION  The best way to protect against getting a cold is to practice good hygiene. Avoid oral or hand contact with people with cold symptoms. Wash your hands often if contact occurs. There is no clear evidence that vitamin C, vitamin E, echinacea, or exercise reduces the chance of developing a cold. However, it is always recommended to get plenty of rest and practice good nutrition. TREATMENT  Treatment is directed at relieving symptoms. There is no cure. Antibiotics are not effective, because the infection is caused by a virus, not by bacteria. Treatment may include:  Increased fluid intake. Sports drinks offer valuable electrolytes, sugars, and fluids.  Breathing heated mist or steam (vaporizer or shower).  Eating chicken soup or other clear broths, and maintaining good nutrition.  Getting plenty of rest.  Using gargles or lozenges for comfort.  Controlling fevers with ibuprofen or acetaminophen as directed by your caregiver.  Increasing usage of your inhaler if you have asthma. Zinc gel and zinc lozenges, taken in the first 24 hours of the common cold, can shorten the duration and lessen the severity of symptoms. Pain medicines may help with fever, muscle aches, and throat pain. A variety of non-prescription medicines are available to treat congestion and runny nose. Your caregiver   can make recommendations and may suggest nasal or lung inhalers for other symptoms.  HOME CARE INSTRUCTIONS   Only take over-the-counter or prescription medicines for pain, discomfort, or fever as directed by your  caregiver.  Use a warm mist humidifier or inhale steam from a shower to increase air moisture. This may keep secretions moist and make it easier to breathe.  Drink enough water and fluids to keep your urine clear or pale yellow.  Rest as needed.  Return to work when your temperature has returned to normal or as your caregiver advises. You may need to stay home longer to avoid infecting others. You can also use a face mask and careful hand washing to prevent spread of the virus. SEEK MEDICAL CARE IF:   After the first few days, you feel you are getting worse rather than better.  You need your caregiver's advice about medicines to control symptoms.  You develop chills, worsening shortness of breath, or brown or red sputum. These may be signs of pneumonia.  You develop yellow or brown nasal discharge or pain in the face, especially when you bend forward. These may be signs of sinusitis.  You develop a fever, swollen neck glands, pain with swallowing, or white areas in the back of your throat. These may be signs of strep throat. SEEK IMMEDIATE MEDICAL CARE IF:   You have a fever.  You develop severe or persistent headache, ear pain, sinus pain, or chest pain.  You develop wheezing, a prolonged cough, cough up blood, or have a change in your usual mucus (if you have chronic lung disease).  You develop sore muscles or a stiff neck. Document Released: 07/31/2000 Document Revised: 04/29/2011 Document Reviewed: 05/12/2013 ExitCare Patient Information 2015 [employer]. This information is not intended to replace advice given to you by your health care provider. Make sure you discuss any questions you have with your health care provider.  

## 2014-09-05 NOTE — Progress Notes (Signed)
Subjective:     Patient ID: Ranell PatrickKenneth D Kloeppel, male   DOB: 03-06-1965, [age] y.o.   MRN: [id number]  HPI Pt with onset of fever, chills, and general malaise several days ago Using OTC meds with minimal relief  Review of Systems  Constitutional: Positive for fever, chills, activity change, appetite change and fatigue.  HENT: Positive for congestion, postnasal drip, sinus pressure and sore throat.   Respiratory: Positive for cough. Negative for chest tightness.   Cardiovascular: Negative.        Objective:   Physical Exam  Constitutional: He appears well-developed and well-nourished.  HENT:  Right Ear: External ear normal.  Left Ear: External ear normal.  Mouth/Throat: Oropharynx is clear and moist. No oropharyngeal exudate.  Neck: Neck supple.  Cardiovascular: Normal rate, regular rhythm and normal heart sounds.   Pulmonary/Chest: Effort normal and breath sounds normal. No respiratory distress. He has no wheezes. He has no rales. He exhibits no tenderness.  Coarse lung sounds that clear with cough  Lymphadenopathy:    He has no cervical adenopathy.  Nursing note and vitals reviewed.      Assessment:     Acute URI    Plan:     Fluids Rest Hydrate Augmentin bid x 10 days

## 2014-10-19 ENCOUNTER — Encounter: Payer: Self-pay | Admitting: *Deleted

## 2014-10-19 ENCOUNTER — Other Ambulatory Visit: Payer: Self-pay | Admitting: Orthopedic Surgery

## 2014-11-04 ENCOUNTER — Ambulatory Visit (HOSPITAL_COMMUNITY)
Admission: RE | Admit: 2014-11-04 | Discharge: 2014-11-04 | Disposition: A | Discharge status: Home / Self Care | Payer: 59 | Source: Ambulatory Visit | Attending: Orthopedic Surgery | Admitting: Orthopedic Surgery

## 2014-11-04 ENCOUNTER — Encounter (INDEPENDENT_AMBULATORY_CARE_PROVIDER_SITE_OTHER): Payer: Self-pay

## 2014-11-04 ENCOUNTER — Encounter (HOSPITAL_COMMUNITY)
Admission: RE | Admit: 2014-11-04 | Discharge: 2014-11-04 | Disposition: A | Discharge status: Home / Self Care | Payer: 59 | Source: Ambulatory Visit | Attending: Obstetrics & Gynecology | Admitting: Obstetrics & Gynecology

## 2014-11-04 ENCOUNTER — Encounter (HOSPITAL_COMMUNITY): Payer: Self-pay

## 2014-11-04 DIAGNOSIS — M19012 Primary osteoarthritis, left shoulder: Secondary | ICD-10-CM | POA: Insufficient documentation

## 2014-11-04 DIAGNOSIS — G4730 Sleep apnea, unspecified: Secondary | ICD-10-CM | POA: Insufficient documentation

## 2014-11-04 DIAGNOSIS — Z01818 Encounter for other preprocedural examination: Secondary | ICD-10-CM | POA: Insufficient documentation

## 2014-11-04 DIAGNOSIS — Z01812 Encounter for preprocedural laboratory examination: Secondary | ICD-10-CM | POA: Insufficient documentation

## 2014-11-04 DIAGNOSIS — F17200 Nicotine dependence, unspecified, uncomplicated: Secondary | ICD-10-CM | POA: Diagnosis not present

## 2014-11-04 HISTORY — DX: Unspecified osteoarthritis, unspecified site: M19.90

## 2014-11-04 HISTORY — DX: Pain in unspecified joint: M25.50

## 2014-11-04 LAB — CBC WITH DIFFERENTIAL/PLATELET
BASOS ABS: 0.1 10*3/uL (ref 0.0–0.1)
BASOS PCT: 1 %
EOS ABS: 0.2 10*3/uL (ref 0.0–0.7)
Eosinophils Relative: 3 %
HEMATOCRIT: 42.7 % (ref 39.0–52.0)
HEMOGLOBIN: 15.1 g/dL (ref 13.0–17.0)
Lymphocytes Relative: 34 %
Lymphs Abs: 3 10*3/uL (ref 0.7–4.0)
MCH: 32.3 pg (ref 26.0–34.0)
MCHC: 35.4 g/dL (ref 30.0–36.0)
MCV: 91.4 fL (ref 78.0–100.0)
Monocytes Absolute: 0.5 10*3/uL (ref 0.1–1.0)
Monocytes Relative: 6 %
NEUTROS ABS: 5 10*3/uL (ref 1.7–7.7)
NEUTROS PCT: 56 %
Platelets: 200 10*3/uL (ref 150–400)
RBC: 4.67 MIL/uL (ref 4.22–5.81)
RDW: 13.3 % (ref 11.5–15.5)
WBC: 8.8 10*3/uL (ref 4.0–10.5)

## 2014-11-04 LAB — COMPREHENSIVE METABOLIC PANEL
ALBUMIN: 3.9 g/dL (ref 3.5–5.0)
ALK PHOS: 63 U/L (ref 38–126)
ALT: 36 U/L (ref 17–63)
ANION GAP: 9 (ref 5–15)
AST: 31 U/L (ref 15–41)
BILIRUBIN TOTAL: 0.6 mg/dL (ref 0.3–1.2)
BUN: 9 mg/dL (ref 6–20)
CALCIUM: 9 mg/dL (ref 8.9–10.3)
CO2: 24 mmol/L (ref 22–32)
CREATININE: 0.99 mg/dL (ref 0.61–1.24)
Chloride: 108 mmol/L (ref 101–111)
GFR calc Af Amer: >60 mL/min (ref >60)
GFR calc non Af Amer: >60 mL/min (ref >60)
GLUCOSE: 128 mg/dL — AB (ref 65–99)
Potassium: 3.5 mmol/L (ref 3.5–5.1)
SODIUM: 141 mmol/L (ref 135–145)
Total Protein: 6.7 g/dL (ref 6.5–8.1)

## 2014-11-04 LAB — SURGICAL PCR SCREEN
MRSA, PCR: NEGATIVE
STAPHYLOCOCCUS AUREUS: NEGATIVE

## 2014-11-04 LAB — APTT: aPTT: 30 seconds (ref 24–37)

## 2014-11-04 LAB — URINALYSIS, ROUTINE W REFLEX MICROSCOPIC
Bilirubin Urine: NEGATIVE
GLUCOSE, UA: NEGATIVE mg/dL
Hgb urine dipstick: NEGATIVE
Ketones, ur: NEGATIVE mg/dL
LEUKOCYTES UA: NEGATIVE
Nitrite: NEGATIVE
PROTEIN: NEGATIVE mg/dL
SPECIFIC GRAVITY, URINE: 1.029 (ref 1.005–1.030)
Urobilinogen, UA: 1 mg/dL (ref 0.0–1.0)
pH: 5.5 (ref 5.0–8.0)

## 2014-11-04 LAB — PROTIME-INR
INR: 1.02 (ref 0.00–1.49)
Prothrombin Time: 13.6 seconds (ref 11.6–15.2)

## 2014-11-04 MED ORDER — POVIDONE-IODINE 7.5 % EX SOLN: Freq: Once | CUTANEOUS | Status: DC

## 2014-11-04 NOTE — Progress Notes (Signed)
Pt doesn't have a cardiologist  Medical MD is Dr.Steven Hyacinth Meeker with [facility]  Stress test done 15+yrs ago  Denies ever having an echo/heart cath  Denies EKG or CXR in past yr

## 2014-11-04 NOTE — Pre-Procedure Instructions (Signed)
NICKOLAOS BRALLIER  11/04/2014      [facility] [address] [place] - [address] [place] [address] [place] [address] Phone: [phone] Fax: [phone]    Your procedure is scheduled on Thurs, Sept 22 @ 10:45 AM  Report to [facility] Admitting at 8:45 AM  Call this number if you have problems the morning of surgery:  [phone]   Remember:  Do not eat food or drink liquids after midnight.  Take these medicines the morning of surgery with A SIP OF WATER Pain Pill(if needed) and Augmentin(Amoxicillin)              Stop taking your Relafen a week before surgery. No Goody's,BC's,Aleve,Aspirin,Ibuprofen,Fish Oil,or any Herbal Medications.    Do not wear jewelry.  Do not wear lotions, powders, or colognes.  You may wear deodorant.  Men may shave face and neck.  Do not bring valuables to the hospital.  [facility] is not responsible for any belongings or valuables.  Contacts, dentures or bridgework may not be worn into surgery.  Leave your suitcase in the car.  After surgery it may be brought to your room.  For patients admitted to the hospital, discharge time will be determined by your treatment team.  Patients discharged the day of surgery will not be allowed to drive home.      Special instructions:  [facility] - Preparing for Surgery  Before surgery, you can play an important role.  Because skin is not sterile, your skin needs to be as free of germs as possible.  You can reduce the number of germs on you skin by washing with CHG (chlorahexidine gluconate) soap before surgery.  CHG is an antiseptic cleaner which kills germs and bonds with the skin to continue killing germs even after washing.  Please DO NOT use if you have an allergy to CHG or antibacterial soaps.  If your skin becomes reddened/irritated stop using the CHG and inform your nurse when you arrive at Short Stay.  Do not shave (including legs and underarms) for at least 48 hours prior to the first  CHG shower.  You may shave your face.  Please follow these instructions carefully:   1.  Shower with CHG Soap the night before surgery and the                                morning of Surgery.  2.  If you choose to wash your hair, wash your hair first as usual with your       normal shampoo.  3.  After you shampoo, rinse your hair and body thoroughly to remove the                      Shampoo.  4.  Use CHG as you would any other liquid soap.  You can apply chg directly       to the skin and wash gently with scrungie or a clean washcloth.  5.  Apply the CHG Soap to your body ONLY FROM THE NECK DOWN.        Do not use on open wounds or open sores.  Avoid contact with your eyes,       ears, mouth and genitals (private parts).  Wash genitals (private parts)       with your normal soap.  6.  Wash thoroughly, paying  special attention to the area where your surgery        will be performed.  7.  Thoroughly rinse your body with warm water from the neck down.  8.  DO NOT shower/wash with your normal soap after using and rinsing off       the CHG Soap.  9.  Pat yourself dry with a clean towel.            10.  Wear clean pajamas.            11.  Place clean sheets on your bed the night of your first shower and do not        sleep with pets.  Day of Surgery  Do not apply any lotions/deoderants the morning of surgery.  Please wear clean clothes to the hospital/surgery center.    Please read over the following fact sheets that you were given. Pain Booklet, Coughing and Deep Breathing, MRSA Information and Surgical Site Infection Prevention

## 2014-11-09 MED ORDER — DEXTROSE 5 % IV SOLN
3.0000 g | INTRAVENOUS | Status: AC
  Administered 2014-11-10: 3 g via INTRAVENOUS
  Filled 2014-11-09 (×2): qty 3000

## 2014-11-10 ENCOUNTER — Inpatient Hospital Stay (HOSPITAL_COMMUNITY): Payer: 59 | Admitting: Anesthesiology

## 2014-11-10 ENCOUNTER — Inpatient Hospital Stay (HOSPITAL_COMMUNITY)
Admission: RE | Admit: 2014-11-10 | Discharge: 2014-11-11 | DRG: 483 | Disposition: A | Discharge status: Home / Self Care | Payer: 59 | Source: Ambulatory Visit | Attending: Orthopedic Surgery | Admitting: Orthopedic Surgery

## 2014-11-10 ENCOUNTER — Encounter (HOSPITAL_COMMUNITY)
Admission: RE | Disposition: A | Discharge status: Home / Self Care | Payer: Self-pay | Source: Ambulatory Visit | Attending: Orthopedic Surgery

## 2014-11-10 ENCOUNTER — Inpatient Hospital Stay (HOSPITAL_COMMUNITY): Payer: 59

## 2014-11-10 ENCOUNTER — Encounter (HOSPITAL_COMMUNITY): Payer: Self-pay | Admitting: *Deleted

## 2014-11-10 DIAGNOSIS — F17210 Nicotine dependence, cigarettes, uncomplicated: Secondary | ICD-10-CM | POA: Diagnosis present

## 2014-11-10 DIAGNOSIS — M19012 Primary osteoarthritis, left shoulder: Secondary | ICD-10-CM | POA: Diagnosis present

## 2014-11-10 DIAGNOSIS — M19019 Primary osteoarthritis, unspecified shoulder: Secondary | ICD-10-CM | POA: Diagnosis present

## 2014-11-10 DIAGNOSIS — Z79899 Other long term (current) drug therapy: Secondary | ICD-10-CM | POA: Diagnosis not present

## 2014-11-10 DIAGNOSIS — G4733 Obstructive sleep apnea (adult) (pediatric): Secondary | ICD-10-CM | POA: Diagnosis present

## 2014-11-10 DIAGNOSIS — Z96612 Presence of left artificial shoulder joint: Secondary | ICD-10-CM

## 2014-11-10 DIAGNOSIS — Z79891 Long term (current) use of opiate analgesic: Secondary | ICD-10-CM | POA: Diagnosis not present

## 2014-11-10 HISTORY — DX: Obstructive sleep apnea (adult) (pediatric): G47.33

## 2014-11-10 HISTORY — DX: Dependence on other enabling machines and devices: Z99.89

## 2014-11-10 HISTORY — PX: TOTAL SHOULDER ARTHROPLASTY: SHX126

## 2014-11-10 SURGERY — ARTHROPLASTY, SHOULDER, TOTAL
Anesthesia: General | Site: Shoulder | Laterality: Left

## 2014-11-10 MED ORDER — ALUM & MAG HYDROXIDE-SIMETH 200-200-20 MG/5ML PO SUSP: 30.0000 mL | ORAL | Status: DC | PRN

## 2014-11-10 MED ORDER — NEOSTIGMINE METHYLSULFATE 10 MG/10ML IV SOLN
INTRAVENOUS | Status: DC | PRN
  Administered 2014-11-10: 5 mg via INTRAVENOUS

## 2014-11-10 MED ORDER — HYDROMORPHONE HCL 1 MG/ML IJ SOLN: 0.2500 mg | INTRAMUSCULAR | Status: DC | PRN

## 2014-11-10 MED ORDER — FENTANYL CITRATE (PF) 100 MCG/2ML IJ SOLN
INTRAMUSCULAR | Status: AC
  Administered 2014-11-10: 50 ug via INTRAVENOUS
  Filled 2014-11-10: qty 2

## 2014-11-10 MED ORDER — ACETAMINOPHEN 325 MG PO TABS: 650.0000 mg | ORAL_TABLET | Freq: Four times a day (QID) | ORAL | Status: DC | PRN

## 2014-11-10 MED ORDER — FENTANYL CITRATE (PF) 250 MCG/5ML IJ SOLN
INTRAMUSCULAR | Status: AC
  Filled 2014-11-10: qty 5

## 2014-11-10 MED ORDER — MIDAZOLAM HCL 2 MG/2ML IJ SOLN
INTRAMUSCULAR | Status: AC
  Filled 2014-11-10: qty 4

## 2014-11-10 MED ORDER — CEFAZOLIN SODIUM-DEXTROSE 2-3 GM-% IV SOLR
2.0000 g | Freq: Four times a day (QID) | INTRAVENOUS | Status: AC
  Administered 2014-11-10 – 2014-11-11 (×3): 2 g via INTRAVENOUS
  Filled 2014-11-10 (×4): qty 50

## 2014-11-10 MED ORDER — LACTATED RINGERS IV SOLN
INTRAVENOUS | Status: DC
  Administered 2014-11-10 (×3): via INTRAVENOUS

## 2014-11-10 MED ORDER — GLYCOPYRROLATE 0.2 MG/ML IJ SOLN
INTRAMUSCULAR | Status: DC | PRN
  Administered 2014-11-10: 0.6 mg via INTRAVENOUS

## 2014-11-10 MED ORDER — PHENOL 1.4 % MT LIQD: 1.0000 | OROMUCOSAL | Status: DC | PRN

## 2014-11-10 MED ORDER — MIDAZOLAM HCL 2 MG/2ML IJ SOLN: 0.5000 mg | Freq: Once | INTRAMUSCULAR | Status: DC | PRN

## 2014-11-10 MED ORDER — LIDOCAINE HCL (CARDIAC) 20 MG/ML IV SOLN
INTRAVENOUS | Status: AC
  Filled 2014-11-10: qty 5

## 2014-11-10 MED ORDER — PROPOFOL 10 MG/ML IV BOLUS
INTRAVENOUS | Status: AC
  Filled 2014-11-10: qty 20

## 2014-11-10 MED ORDER — DOCUSATE SODIUM 100 MG PO CAPS
100.0000 mg | ORAL_CAPSULE | Freq: Two times a day (BID) | ORAL | Status: DC
  Administered 2014-11-10 – 2014-11-11 (×2): 100 mg via ORAL
  Filled 2014-11-10 (×2): qty 1

## 2014-11-10 MED ORDER — POLYETHYLENE GLYCOL 3350 17 G PO PACK: 17.0000 g | PACK | Freq: Every day | ORAL | Status: DC | PRN

## 2014-11-10 MED ORDER — METHOCARBAMOL 500 MG PO TABS
500.0000 mg | ORAL_TABLET | Freq: Four times a day (QID) | ORAL | Status: DC | PRN
  Administered 2014-11-11 (×2): 500 mg via ORAL
  Filled 2014-11-10 (×2): qty 1

## 2014-11-10 MED ORDER — ZOLPIDEM TARTRATE 5 MG PO TABS: 5.0000 mg | ORAL_TABLET | Freq: Every evening | ORAL | Status: DC | PRN

## 2014-11-10 MED ORDER — MIDAZOLAM HCL 2 MG/2ML IJ SOLN
INTRAMUSCULAR | Status: AC
  Administered 2014-11-10: 1 mg via INTRAVENOUS
  Filled 2014-11-10: qty 2

## 2014-11-10 MED ORDER — PROMETHAZINE HCL 25 MG/ML IJ SOLN: 6.2500 mg | INTRAMUSCULAR | Status: DC | PRN

## 2014-11-10 MED ORDER — SODIUM CHLORIDE 0.9 % IR SOLN
Status: DC | PRN
  Administered 2014-11-10: 3000 mL

## 2014-11-10 MED ORDER — DIPHENHYDRAMINE HCL 12.5 MG/5ML PO ELIX: 12.5000 mg | ORAL_SOLUTION | ORAL | Status: DC | PRN

## 2014-11-10 MED ORDER — ONDANSETRON HCL 4 MG/2ML IJ SOLN
INTRAMUSCULAR | Status: DC | PRN
  Administered 2014-11-10: 4 mg via INTRAVENOUS

## 2014-11-10 MED ORDER — ACETAMINOPHEN 500 MG PO TABS
1000.0000 mg | ORAL_TABLET | Freq: Four times a day (QID) | ORAL | Status: DC
  Administered 2014-11-10 – 2014-11-11 (×3): 1000 mg via ORAL
  Filled 2014-11-10 (×3): qty 2

## 2014-11-10 MED ORDER — DEXAMETHASONE SODIUM PHOSPHATE 4 MG/ML IJ SOLN
INTRAMUSCULAR | Status: AC
  Filled 2014-11-10: qty 1

## 2014-11-10 MED ORDER — ONDANSETRON HCL 4 MG/2ML IJ SOLN
INTRAMUSCULAR | Status: AC
  Filled 2014-11-10: qty 2

## 2014-11-10 MED ORDER — DEXTROSE 5 % IV SOLN
10.0000 mg | INTRAVENOUS | Status: DC | PRN
  Administered 2014-11-10: 10 ug/min via INTRAVENOUS

## 2014-11-10 MED ORDER — ROCURONIUM BROMIDE 50 MG/5ML IV SOLN
INTRAVENOUS | Status: AC
  Filled 2014-11-10: qty 1

## 2014-11-10 MED ORDER — METHOCARBAMOL 1000 MG/10ML IJ SOLN
500.0000 mg | Freq: Four times a day (QID) | INTRAMUSCULAR | Status: DC | PRN
  Filled 2014-11-10: qty 5

## 2014-11-10 MED ORDER — ACETAMINOPHEN 650 MG RE SUPP: 650.0000 mg | Freq: Four times a day (QID) | RECTAL | Status: DC | PRN

## 2014-11-10 MED ORDER — MENTHOL 3 MG MT LOZG: 1.0000 | LOZENGE | OROMUCOSAL | Status: DC | PRN

## 2014-11-10 MED ORDER — MEPERIDINE HCL 25 MG/ML IJ SOLN: 6.2500 mg | INTRAMUSCULAR | Status: DC | PRN

## 2014-11-10 MED ORDER — OXYCODONE HCL 5 MG PO TABS
5.0000 mg | ORAL_TABLET | ORAL | Status: DC | PRN
  Administered 2014-11-11 (×3): 10 mg via ORAL
  Filled 2014-11-10 (×3): qty 2

## 2014-11-10 MED ORDER — BISACODYL 5 MG PO TBEC: 5.0000 mg | DELAYED_RELEASE_TABLET | Freq: Every day | ORAL | Status: DC | PRN

## 2014-11-10 MED ORDER — PROPOFOL 10 MG/ML IV BOLUS
INTRAVENOUS | Status: DC | PRN
  Administered 2014-11-10: 100 mg via INTRAVENOUS
  Administered 2014-11-10: 200 mg via INTRAVENOUS

## 2014-11-10 MED ORDER — ASPIRIN EC 325 MG PO TBEC
325.0000 mg | DELAYED_RELEASE_TABLET | Freq: Two times a day (BID) | ORAL | Status: DC
  Administered 2014-11-10 – 2014-11-11 (×2): 325 mg via ORAL
  Filled 2014-11-10 (×2): qty 1

## 2014-11-10 MED ORDER — FLEET ENEMA 7-19 GM/118ML RE ENEM: 1.0000 | ENEMA | Freq: Once | RECTAL | Status: DC | PRN

## 2014-11-10 MED ORDER — ONDANSETRON HCL 4 MG/2ML IJ SOLN: 4.0000 mg | Freq: Four times a day (QID) | INTRAMUSCULAR | Status: DC | PRN

## 2014-11-10 MED ORDER — MORPHINE SULFATE (PF) 2 MG/ML IV SOLN
2.0000 mg | INTRAVENOUS | Status: DC | PRN
Start: 2014-11-10 — End: 2014-11-11
  Administered 2014-11-10 – 2014-11-11 (×4): 2 mg via INTRAVENOUS
  Filled 2014-11-10 (×4): qty 1

## 2014-11-10 MED ORDER — ROCURONIUM BROMIDE 100 MG/10ML IV SOLN
INTRAVENOUS | Status: DC | PRN
  Administered 2014-11-10: 50 mg via INTRAVENOUS

## 2014-11-10 MED ORDER — VECURONIUM BROMIDE 10 MG IV SOLR
INTRAVENOUS | Status: DC | PRN
  Administered 2014-11-10 (×2): 2 mg via INTRAVENOUS

## 2014-11-10 MED ORDER — 0.9 % SODIUM CHLORIDE (POUR BTL) OPTIME
TOPICAL | Status: DC | PRN
  Administered 2014-11-10: 1000 mL

## 2014-11-10 MED ORDER — BUPIVACAINE-EPINEPHRINE (PF) 0.5% -1:200000 IJ SOLN
INTRAMUSCULAR | Status: DC | PRN
  Administered 2014-11-10: 30 mL via PERINEURAL

## 2014-11-10 MED ORDER — ONDANSETRON HCL 4 MG PO TABS: 4.0000 mg | ORAL_TABLET | Freq: Four times a day (QID) | ORAL | Status: DC | PRN

## 2014-11-10 MED ORDER — METOCLOPRAMIDE HCL 5 MG/ML IJ SOLN: 5.0000 mg | Freq: Three times a day (TID) | INTRAMUSCULAR | Status: DC | PRN

## 2014-11-10 MED ORDER — METOCLOPRAMIDE HCL 5 MG PO TABS: 5.0000 mg | ORAL_TABLET | Freq: Three times a day (TID) | ORAL | Status: DC | PRN

## 2014-11-10 MED ORDER — SODIUM CHLORIDE 0.9 % IV SOLN
INTRAVENOUS | Status: DC
  Administered 2014-11-10 – 2014-11-11 (×2): via INTRAVENOUS

## 2014-11-10 SURGICAL SUPPLY — 72 items
BIT DRILL 5/64X5 DISP (BIT) ×2 IMPLANT
BLADE SAW SAG 73X25 THK (BLADE) ×2
BLADE SAW SGTL 73X25 THK (BLADE) ×1 IMPLANT
BLADE SURG 15 STRL LF DISP TIS (BLADE) ×1 IMPLANT
BLADE SURG 15 STRL SS (BLADE) ×3
CAP SHOULDER TOTAL 2 ×2 IMPLANT
CEMENT BONE DEPUY (Cement) ×2 IMPLANT
CHLORAPREP W/TINT 26ML (MISCELLANEOUS) ×5 IMPLANT
CLOSURE STERI-STRIP 1/2X4 (GAUZE/BANDAGES/DRESSINGS) ×1
CLOSURE WOUND 1/2 X4 (GAUZE/BANDAGES/DRESSINGS) ×1
CLSR STERI-STRIP ANTIMIC 1/2X4 (GAUZE/BANDAGES/DRESSINGS) ×1 IMPLANT
COVER MAYO STAND STRL (DRAPES) ×1 IMPLANT
COVER SURGICAL LIGHT HANDLE (MISCELLANEOUS) ×3 IMPLANT
DRAPE IMP U-DRAPE 54X76 (DRAPES) ×3 IMPLANT
DRAPE INCISE IOBAN 66X45 STRL (DRAPES) ×6 IMPLANT
DRAPE ORTHO SPLIT 77X108 STRL (DRAPES) ×6
DRAPE SURG 17X23 STRL (DRAPES) ×3 IMPLANT
DRAPE SURG ORHT 6 SPLT 77X108 (DRAPES) ×2 IMPLANT
DRAPE U-SHAPE 47X51 STRL (DRAPES) ×3 IMPLANT
DRSG AQUACEL AG ADV 3.5X10 (GAUZE/BANDAGES/DRESSINGS) ×2 IMPLANT
ELECT BLADE 4.0 EZ CLEAN MEGAD (MISCELLANEOUS)
ELECT REM PT RETURN 9FT ADLT (ELECTROSURGICAL) ×3
ELECTRODE BLDE 4.0 EZ CLN MEGD (MISCELLANEOUS) IMPLANT
ELECTRODE REM PT RTRN 9FT ADLT (ELECTROSURGICAL) ×1 IMPLANT
EVACUATOR 1/8 PVC DRAIN (DRAIN) IMPLANT
GLOVE BIO SURGEON STRL SZ7 (GLOVE) ×3 IMPLANT
GLOVE BIO SURGEON STRL SZ7.5 (GLOVE) ×3 IMPLANT
GLOVE BIOGEL PI IND STRL 7.0 (GLOVE) ×1 IMPLANT
GLOVE BIOGEL PI IND STRL 8 (GLOVE) ×1 IMPLANT
GLOVE BIOGEL PI INDICATOR 7.0 (GLOVE) ×2
GLOVE BIOGEL PI INDICATOR 8 (GLOVE) ×2
GOWN STRL REUS W/ TWL LRG LVL3 (GOWN DISPOSABLE) ×1 IMPLANT
GOWN STRL REUS W/ TWL XL LVL3 (GOWN DISPOSABLE) ×1 IMPLANT
GOWN STRL REUS W/TWL LRG LVL3 (GOWN DISPOSABLE) ×3
GOWN STRL REUS W/TWL XL LVL3 (GOWN DISPOSABLE) ×3
GUIDE WIRE 2.5X220MM ×2 IMPLANT
HANDPIECE INTERPULSE COAX TIP (DISPOSABLE) ×3
HEMOSTAT SURGICEL 2X14 (HEMOSTASIS) ×3 IMPLANT
HOOD PEEL AWAY FACE SHEILD DIS (HOOD) ×6 IMPLANT
KIT BASIN OR (CUSTOM PROCEDURE TRAY) ×3 IMPLANT
KIT ROOM TURNOVER OR (KITS) ×3 IMPLANT
MANIFOLD NEPTUNE II (INSTRUMENTS) ×3 IMPLANT
NDL HYPO 25GX1X1/2 BEV (NEEDLE) IMPLANT
NDL MAYO TROCAR (NEEDLE) ×1 IMPLANT
NEEDLE HYPO 25GX1X1/2 BEV (NEEDLE) IMPLANT
NEEDLE MAYO TROCAR (NEEDLE) ×3 IMPLANT
NS IRRIG 1000ML POUR BTL (IV SOLUTION) ×3 IMPLANT
PACK SHOULDER (CUSTOM PROCEDURE TRAY) ×3 IMPLANT
PAD ARMBOARD 7.5X6 YLW CONV (MISCELLANEOUS) ×6 IMPLANT
PENCIL BUTTON HOLSTER BLD 10FT (ELECTRODE) ×2 IMPLANT
RETRIEVER SUT HEWSON (MISCELLANEOUS) ×3 IMPLANT
SET HNDPC FAN SPRY TIP SCT (DISPOSABLE) ×1 IMPLANT
SLING ARM IMMOBILIZER LRG (SOFTGOODS) ×3 IMPLANT
SLING ARM IMMOBILIZER MED (SOFTGOODS) IMPLANT
SMARTMIX MINI TOWER (MISCELLANEOUS) ×3
SPONGE LAP 18X18 X RAY DECT (DISPOSABLE) ×3 IMPLANT
SPONGE LAP 4X18 X RAY DECT (DISPOSABLE) ×2 IMPLANT
STRIP CLOSURE SKIN 1/2X4 (GAUZE/BANDAGES/DRESSINGS) ×2 IMPLANT
SUCTION FRAZIER TIP 10 FR DISP (SUCTIONS) ×3 IMPLANT
SUPPORT WRAP ARM LG (MISCELLANEOUS) ×3 IMPLANT
SUT ETHIBOND NAB CT1 #1 30IN (SUTURE) ×3 IMPLANT
SUT MNCRL AB 4-0 PS2 18 (SUTURE) ×3 IMPLANT
SUT SILK 2 0 TIES 17X18 (SUTURE) ×3
SUT SILK 2-0 18XBRD TIE BLK (SUTURE) IMPLANT
SUT VIC AB 0 CTB1 27 (SUTURE) ×2 IMPLANT
SUT VIC AB 2-0 CT1 27 (SUTURE) ×3
SUT VIC AB 2-0 CT1 TAPERPNT 27 (SUTURE) ×1 IMPLANT
SYR CONTROL 10ML LL (SYRINGE) IMPLANT
TAPE FIBER 2MM 7IN #2 BLUE (SUTURE) ×3 IMPLANT
TOWEL OR 17X24 6PK STRL BLUE (TOWEL DISPOSABLE) ×3 IMPLANT
TOWEL OR 17X26 10 PK STRL BLUE (TOWEL DISPOSABLE) ×3 IMPLANT
TOWER SMARTMIX MINI (MISCELLANEOUS) ×1 IMPLANT

## 2014-11-10 NOTE — Anesthesia Postprocedure Evaluation (Signed)
  Anesthesia Post-op Note  Patient: Paul Wright  Procedure(s) Performed: Procedure(s) with comments: TOTAL SHOULDER ARTHROPLASTY (Left) - Left total shoulder arthroplasty  Patient Location: PACU  Anesthesia Type:GA combined with regional for post-op pain  Level of Consciousness: awake, alert , oriented and patient cooperative  Airway and Oxygen Therapy: Patient Spontanous Breathing  Post-op Pain: none  Post-op Assessment: Post-op Vital signs reviewed, Patient's Cardiovascular Status Stable, Respiratory Function Stable, Patent Airway, No signs of Nausea or vomiting and Pain level controlled LLE Motor Response: Purposeful movement LLE Sensation: Full sensation RLE Motor Response: Purposeful movement RLE Sensation: Full sensation      Post-op Vital Signs: Reviewed and stable  Last Vitals:  Filed Vitals:   11/10/14 1532  BP: 115/79  Pulse: 98  Temp: 36.7 C  Resp: 16    Complications: No apparent anesthesia complications

## 2014-11-10 NOTE — Anesthesia Preprocedure Evaluation (Addendum)
Anesthesia Evaluation  Patient identified by MRN, date of birth, ID band Patient awake    Reviewed: Allergy & Precautions, NPO status , Patient's Chart, lab work & pertinent test results  History of Anesthesia Complications Negative for: history of anesthetic complications  Airway Mallampati: II  TM Distance: >3 FB Neck ROM: Full    Dental  (+) Dental Advisory Given, Teeth Intact   Pulmonary sleep apnea and Continuous Positive Airway Pressure Ventilation , Current Smoker,    breath sounds clear to auscultation       Cardiovascular negative cardio ROS   Rhythm:Regular Rate:Normal     Neuro/Psych negative neurological ROS     GI/Hepatic negative GI ROS, Neg liver ROS,   Endo/Other  Morbid obesity  Renal/GU negative Renal ROS     Musculoskeletal  (+) Arthritis , Osteoarthritis,    Abdominal (+) + obese,   Peds  Hematology negative hematology ROS (+)   Anesthesia Other Findings   Reproductive/Obstetrics                           Anesthesia Physical Anesthesia Plan  ASA: III  Anesthesia Plan: General   Post-op Pain Management: GA combined w/ Regional for post-op pain   Induction: Intravenous  Airway Management Planned: Oral ETT  Additional Equipment:   Intra-op Plan:   Post-operative Plan: Extubation in OR  Informed Consent: I have reviewed the patients History and Physical, chart, labs and discussed the procedure including the risks, benefits and alternatives for the proposed anesthesia with the patient or authorized representative who has indicated his/her understanding and acceptance.   Dental advisory given  Plan Discussed with: CRNA and Surgeon  Anesthesia Plan Comments: (Plan routine monitors, GETA with interscalene block for post op analgesia)        Anesthesia Quick Evaluation

## 2014-11-10 NOTE — Progress Notes (Signed)
Utilization review completed. Bertha Stanfill, RN, BSN. 

## 2014-11-10 NOTE — Anesthesia Procedure Notes (Addendum)
Anesthesia Regional Block:  Interscalene brachial plexus block  Pre-Anesthetic Checklist: ,, timeout performed, Correct Patient, Correct Site, Correct Laterality, Correct Procedure, Correct Position, site marked, Risks and benefits discussed,  Surgical consent,  Pre-op evaluation,  At surgeon's request and post-op pain management  Laterality: Left and Upper  Prep: chloraprep       Needles:  Injection technique: Single-shot     Needle Length: 5cm 5 cm Needle Gauge: 22 and 22 G    Additional Needles:  Procedures: nerve stimulator Interscalene brachial plexus block  Nerve Stimulator or Paresthesia:  Response: forearm twitch, 0.45 mA, 0.1 ms,   Additional Responses:   Narrative:  Start time: 11/10/2014 9:38 AM End time: 11/10/2014 9:44 AM Injection made incrementally with aspirations every 5 mL.  Performed by: Personally  Anesthesiologist: Jean Rosenthal, CARSWELL  Additional Notes: Pt identified in Holding room.  Monitors applied. Working IV access confirmed. Sterile prep L neck.  #22ga PNS to forearm twitch at 0.61mA threshold.  30cc 0.5% Bupivacaine with 1:200k epi injected incrementally after negative test dose.  Patient asymptomatic, VSS, no heme aspirated, tolerated well.  Sandford Craze, MD   Procedure Name: Intubation Date/Time: 11/10/2014 11:10 AM Performed by: Lovie Chol Pre-anesthesia Checklist: Patient identified, Emergency Drugs available, Suction available, Patient being monitored and Timeout performed Patient Re-evaluated:Patient Re-evaluated prior to inductionOxygen Delivery Method: Circle system utilized Preoxygenation: Pre-oxygenation with 100% oxygen Ventilation: Mask ventilation without difficulty, Oral airway inserted - appropriate to patient size and Two handed mask ventilation required Laryngoscope Size: Mac and 4 Grade View: Grade I Tube type: Oral Tube size: 8.0 mm Number of attempts: 1 Airway Equipment and Method: Stylet Placement Confirmation: ETT  inserted through vocal cords under direct vision,  positive ETCO2,  CO2 detector and breath sounds checked- equal and bilateral Secured at: 23 cm Tube secured with: Tape Dental Injury: Teeth and Oropharynx as per pre-operative assessment

## 2014-11-10 NOTE — H&P (Signed)
Paul Wright is an 49 y.o. male.   Chief Complaint: Left shoulder pain and dysfunction HPI: Left shoulder endstage arthritis with good dysfunction which interfered with quality of life and sleep. He failed conservative management with NSAIDs, activity modification, injections.  Past Medical History  Diagnosis Date  . Sleep apnea   . Arthritis   . Joint pain     Past Surgical History  Procedure Laterality Date  . Knee arthroscopy Left   . Appendectomy      Family History  Problem Relation Age of Onset  . Arthritis    . Heart disease Mother   . Diabetes Mother   . Heart attack Father   . Graves' disease Sister   . Hypothyroidism Brother   . Hypertension Maternal Aunt   . Diabetes Sister   . Heart disease Sister   . Heart disease Sister   . Heart disease Brother   . Diabetes Brother    Social History:  reports that he has been smoking.  He has never used smokeless tobacco. He reports that he does not drink alcohol or use illicit drugs.  Allergies: No Known Allergies  Medications Prior to Admission  Medication Sig Dispense Refill  . acetaminophen (TYLENOL) 500 MG tablet Take 1,000 mg by mouth every 6 (six) hours as needed.    Marland Kitchen HYDROcodone-acetaminophen (NORCO/VICODIN) 5-325 MG per tablet Take 1 tablet by mouth every 6 (six) hours as needed for moderate pain. (Patient not taking: Reported on 11/04/2014) 84 tablet 0  . nabumetone (RELAFEN) 500 MG tablet Take 1 tablet (500 mg total) by mouth 2 (two) times daily. (Patient not taking: Reported on 11/04/2014) 60 tablet 5    No results found for this or any previous visit (from the past 48 hour(s)). No results found.  Review of Systems  All other systems reviewed and are negative.   Blood pressure 119/77, pulse 84, temperature 97 F (36.1 C), temperature source Oral, resp. rate 18, weight 135.172 kg (298 lb), SpO2 96 %. Physical Exam  Constitutional: He is oriented to person, place, and time. He appears well-developed and  well-nourished.  HENT:  Head: Atraumatic.  Eyes: EOM are normal.  Cardiovascular: Intact distal pulses.   Respiratory: Effort normal.  Musculoskeletal:  Left shoulder pain with limited range of motion.  Neurological: He is alert and oriented to person, place, and time.  Skin: Skin is warm and dry.  Psychiatric: He has a normal mood and affect.     Assessment/Plan Left shoulder endstage osteoarthritis  Plan: Left total shoulder arthroplasty Risks / benefits of surgery discussed Consent on chart  NPO for OR Preop antibiotics   CHANDLER,JUSTIN WILLIAM 11/10/2014, 10:34 AM

## 2014-11-10 NOTE — Op Note (Signed)
Procedure(s): TOTAL SHOULDER ARTHROPLASTY Procedure Note  Paul Wright male 49 y.o. 11/10/2014  Procedure(s) and Anesthesia Type:    *LEFT TOTAL SHOULDER ARTHROPLASTY - General  Surgeon(s) and Role:    * Jones Broom, MD - Primary   Indications:  49 y.o. male  With endstage left shoulder arthritis. Pain and dysfunction interfered with quality of life and nonoperative treatment with activity modification, NSAIDS and injections failed.     Surgeon: Mable Paris   Assistants: Damita Lack PA-C [facility] was present and scrubbed throughout the procedure and was essential in positioning, retraction, exposure, and closure)  Anesthesia: General endotracheal anesthesia with preoperative interscalene block given by the attending anesthesiologist     Procedure Detail  TOTAL SHOULDER ARTHROPLASTY  Findings: Tornier flex anatomic press-fit size 6 stem with a 52 x 19 low offset head, cemented size 40 large Cortiloc glenoid.   A lesser tuberosity osteotomy was performed and repaired at the conclusion of the procedure.  Estimated Blood Loss:  300 mL         Drains: None   Blood Given: none          Specimens: none        Complications:  * No complications entered in OR log *         Disposition: PACU - hemodynamically stable.         Condition: stable    Procedure:   The patient was identified in the preoperative holding area where I personally marked the operative extremity after verifying with the patient and consent. He  was taken to the operating room where He was transferred to the   operative table.  The patient received an interscalene block in   the holding area by the attending anesthesiologist.  General anesthesia was induced   in the operating room without complication.  The patient did receive IV  Ancef prior to the commencement of the procedure.  The patient was   placed in the beach-chair position with the back raised about 30   degrees.   The nonoperative extremity and head and neck were carefully   positioned and padded protecting against neurovascular compromise.  The   left upper extremity was then prepped and draped in the standard sterile   fashion.    The appropriate operative time-out was performed with   Anesthesia, the perioperative staff, as well as myself and we all agreed   that the left side was the correct operative site.  An approximately   10 cm incision was made from the tip of the coracoid to the center point of the   humerus at the level of the axilla.  Dissection was carried down sharply   through subcutaneous tissues and cephalic vein was identified and taken   laterally with the deltoid.  The pectoralis major was taken medially.  The   upper 1 cm of the pectoralis major was released from its attachment on   the humerus.  The clavipectoral fascia was incised just lateral to the   conjoined tendon.  This incision was carried up to but not into the   coracoacromial ligament.  Digital palpation was used to prove   integrity of the axillary nerve which was protected throughout the   procedure.  Musculocutaneous nerve was not palpated in the operative   field.  Conjoined tendon was then retracted gently medially and the   deltoid laterally.  Anterior circumflex humeral vessels were clamped and   coagulated.  The soft tissues overlying  the biceps was incised and this   incision was carried across the transverse humeral ligament to the base   of the coracoid.  The biceps was tenodesed to the soft tissue just above   pectoralis major and the remaining portion of the biceps superiorly was   excised.  An osteotomy was performed at the lesser tuberosity.  Capsule was then   released all the way down to the 6 o'clock position of the humeral head.   The humeral head was then delivered with simultaneous adduction,   extension and external rotation.  All humeral osteophytes were removed   and the anatomic neck of the  humerus was marked and cut free hand at   approximately 25 degrees retroversion within about 3 mm of the cuff   reflection posteriorly.  The head size was estimated to be a 52 medium   offset.  At that point, the humeral head was retracted posteriorly with   a Fukuda retractor. The joint was noted to be extremely tight with 0 of preoperative external rotation.   Remaining portion of the capsule was released at the base of the   coracoid.  The remaining biceps anchor and the entire anterior-inferior   labrum was excised.  The posterior labrum was also excised but the   posterior capsule was not released.  Anterior capsule was released down to the 6:00 position. This allowed adequate exposure. The guidepin was placed bicortically with +0 elevated guide.  The reamer was used to ream to concentric bone with punctate bleeding.  This gave an excellent concentric surface.  The center hole was then drilled for an anchor peg glenoid followed by the three peripheral holes and none of the holes   exited the glenoid wall.  I then pulse irrigated these holes and dried   them with Surgicel.  The three peripheral holes were then   pressurized cemented and the anchor peg glenoid was placed and impacted   with an excellent fit.  The glenoid was a 40 large component.  The proximal humerus was then again exposed taking care not to displace the glenoid.    The entry awl was used followed by sounding reamers and then sequentially broached from size 3 to 6. This was then left in place and the calcar planer was used. Trial head was placed with a 52 x 19 low offset.  With the trial implantation of the component,  there was approximately 50% posterior translation with immediate snap back to the   anatomic position.  With forward elevation, there was no tendency   towards posterior subluxation.   The trial was removed and the final implant was prepared on a back table.  The trial was removed and the final implant was prepared  on a back table.   Small holes were drilled on the medial side of the lesser tuberosity osteotomy, through which 2 labral tapes were passed. The implant was then placed through the loop of the tapes and impacted with an excellent press-fit. This achieved excellent anatomic reconstruction of the proximal humerus.  The joint was then copiously irrigated with pulse lavage.  The subscapularis and   lesser tuberosity osteotomy were then repaired using the 2 labral tapes previously passed in a double row fashion through bone tunnels laterally tied over a bone bridge..   One #1 Ethibond was placed at the rotator interval just above   the lesser tuberosity. Postoperative range of motion improved from 0 to 20 external rotation comfortably. Copious irrigation  was used. Skin was closed with 2-0 Vicryl sutures in the deep dermal layer and 4-0 Monocryl in a subcuticular  running fashion.  Sterile dressings were then applied including Aquacel.  The patient was placed in a sling and allowed to awaken from general anesthesia and taken to the recovery room in stable condition.      POSTOPERATIVE PLAN:  Early passive range of motion will be allowed with the goal of 0 degrees external rotation and 90 degrees forward elevation.  No internal rotation at this time.  No active motion of the arm until the lesser tuberosity heals.  The patient will likely be kept in the hospital for 1-2 days and then discharged home.

## 2014-11-10 NOTE — Transfer of Care (Signed)
Immediate Anesthesia Transfer of Care Note  Patient: Paul Wright  Procedure(s) Performed: Procedure(s) with comments: TOTAL SHOULDER ARTHROPLASTY (Left) - Left total shoulder arthroplasty  Patient Location: PACU  Anesthesia Type:General  Level of Consciousness: awake, oriented and patient cooperative  Airway & Oxygen Therapy: Patient Spontanous Breathing and Patient connected to face mask oxygen  Post-op Assessment: Report given to RN and Post -op Vital signs reviewed and stable  Post vital signs: Reviewed  Last Vitals:  Filed Vitals:   11/10/14 1001  BP: 119/77  Pulse: 84  Temp:   Resp: 18    Complications: No apparent anesthesia complications

## 2014-11-10 NOTE — Progress Notes (Signed)
Report to Casilda Carls RN as primary caregiver.

## 2014-11-11 ENCOUNTER — Encounter (HOSPITAL_COMMUNITY): Payer: Self-pay | Admitting: Orthopedic Surgery

## 2014-11-11 LAB — BASIC METABOLIC PANEL
Anion gap: 11 (ref 5–15)
BUN: 11 mg/dL (ref 6–20)
CHLORIDE: 106 mmol/L (ref 101–111)
CO2: 22 mmol/L (ref 22–32)
CREATININE: 0.84 mg/dL (ref 0.61–1.24)
Calcium: 8.9 mg/dL (ref 8.9–10.3)
GFR calc non Af Amer: >60 mL/min (ref >60)
Glucose, Bld: 111 mg/dL — ABNORMAL HIGH (ref 65–99)
Potassium: 4.3 mmol/L (ref 3.5–5.1)
Sodium: 139 mmol/L (ref 135–145)

## 2014-11-11 LAB — CBC
HEMATOCRIT: 39.2 % (ref 39.0–52.0)
HEMOGLOBIN: 13.8 g/dL (ref 13.0–17.0)
MCH: 32.7 pg (ref 26.0–34.0)
MCHC: 35.2 g/dL (ref 30.0–36.0)
MCV: 92.9 fL (ref 78.0–100.0)
Platelets: 204 10*3/uL (ref 150–400)
RBC: 4.22 MIL/uL (ref 4.22–5.81)
RDW: 13.7 % (ref 11.5–15.5)
WBC: 15.5 10*3/uL — ABNORMAL HIGH (ref 4.0–10.5)

## 2014-11-11 MED ORDER — OXYCODONE-ACETAMINOPHEN 5-325 MG PO TABS: 1.0000 | ORAL_TABLET | ORAL | Status: DC | PRN

## 2014-11-11 MED ORDER — DOCUSATE SODIUM 100 MG PO CAPS: 100.0000 mg | ORAL_CAPSULE | Freq: Three times a day (TID) | ORAL | Status: DC | PRN

## 2014-11-11 NOTE — Progress Notes (Signed)
Placed patient on CPAP at 14cm and oxygen set at 2lpm.

## 2014-11-11 NOTE — Progress Notes (Signed)
   PATIENT ID: Paul Wright   1 Day Post-Op Procedure(s) (LRB): TOTAL SHOULDER ARTHROPLASTY (Left)  Subjective: Doing well this am. Getting up with OT. Pain minimal, block is wearing off. No other complaints or concerns.   Objective:  Filed Vitals:   11/11/14 0433  BP: 118/69  Pulse: 72  Temp: 97.8 F (36.6 C)  Resp: 18     Left shoulder dressing with scant dried blood, intact Wiggles fingers, activates deltoid Distally NVI  Labs:   Recent Labs  11/11/14 0445  HGB 13.8   Recent Labs  11/11/14 0445  WBC 15.5*  RBC 4.22  HCT 39.2  PLT 204   Recent Labs  11/11/14 0445  NA 139  K 4.3  CL 106  CO2 22  BUN 11  CREATININE 0.84  GLUCOSE 111*  CALCIUM 8.9    Assessment and Plan:  1 day s/p left TSA Sling OT- PROM limited to 90 FF and 0 ER D/c home today Scripts in chart  VTE proph: ASA  BID, SCDs

## 2014-11-11 NOTE — Discharge Summary (Signed)
Patient ID: Paul Wright MRN: [id number] DOB/AGE: Jun 26, 1965 49 y.o.  Admit date: 11/10/2014 Discharge date: 11/11/2014  Admission Diagnoses:  Active Problems:   Glenohumeral arthritis   Discharge Diagnoses:  Same  Past Medical History  Diagnosis Date  . Joint pain   . OSA on CPAP   . Arthritis     "left shoulder" (11/10/2014)    Surgeries: Procedure(s): TOTAL SHOULDER ARTHROPLASTY on 11/10/2014   Consultants:    Discharged Condition: Improved  Hospital Course: Paul Wright is an 49 y.o. male who was admitted 11/10/2014 for operative treatment of left shoulder end stage osteoarthrits. Patient has severe unremitting pain that affects sleep, daily activities, and work/hobbies. After pre-op clearance the patient was taken to the operating room on 11/10/2014 and underwent  Procedure(s): TOTAL SHOULDER ARTHROPLASTY.    Patient was given perioperative antibiotics: Anti-infectives    Start     Dose/Rate Route Frequency Ordered Stop   11/10/14 1730  ceFAZolin (ANCEF) IVPB 2 g/50 mL premix     2 g 100 mL/hr over 30 Minutes Intravenous Every 6 hours 11/10/14 1527 11/11/14 0612   11/10/14 1015  ceFAZolin (ANCEF) 3 g in dextrose 5 % 50 mL IVPB     3 g 160 mL/hr over 30 Minutes Intravenous On call to O.R. 11/09/14 1320 11/10/14 1111       Patient was given sequential compression devices, early ambulation, and ASA  BID to prevent DVT.  Patient benefited maximally from hospital stay and there were no complications.    Recent vital signs: Patient Vitals for the past 24 hrs:  BP Temp Temp src Pulse Resp SpO2  11/11/14 0433 118/69 mmHg 97.8 F (36.6 C) Oral 72 18 94 %  11/11/14 0040 108/85 mmHg 97.8 F (36.6 C) Oral 81 16 94 %  11/11/14 0007 - - - 85 15 -  11/10/14 2036 (!) 104/59 mmHg 98.3 F (36.8 C) Oral 83 16 95 %  11/10/14 1532 115/79 mmHg 98 F (36.7 C) Oral 98 16 95 %  11/10/14 1505 115/79 mmHg - - 82 15 96 %  11/10/14 1500 - 98 F (36.7 C) - - - -  11/10/14  1449 113/77 mmHg - - 76 14 93 %  11/10/14 1434 113/75 mmHg - - 81 13 95 %  11/10/14 1419 113/77 mmHg - - 78 16 95 %  11/10/14 1413 - - - 82 16 94 %  11/10/14 1404 113/77 mmHg 98.8 F (37.1 C) - 84 17 92 %     Recent laboratory studies:  Recent Labs  11/11/14 0445  WBC 15.5*  HGB 13.8  HCT 39.2  PLT 204  NA 139  K 4.3  CL 106  CO2 22  BUN 11  CREATININE 0.84  GLUCOSE 111*  CALCIUM 8.9     Discharge Medications:     Medication List    STOP taking these medications        HYDROcodone-acetaminophen 5-325 MG per tablet  Commonly known as:  NORCO/VICODIN      TAKE these medications        acetaminophen 500 MG tablet  Commonly known as:  TYLENOL  Take 1,000 mg by mouth every 6 (six) hours as needed.     docusate sodium 100 MG capsule  Commonly known as:  COLACE  Take 1 capsule (100 mg total) by mouth 3 (three) times daily as needed.     nabumetone 500 MG tablet  Commonly known as:  RELAFEN  Take 1 tablet (500 mg  total) by mouth 2 (two) times daily.     oxyCODONE-acetaminophen 5-325 MG per tablet  Commonly known as:  ROXICET  Take 1-2 tablets by mouth every 4 (four) hours as needed for severe pain.        Diagnostic Studies: Dg Chest 2 View  11/04/2014   CLINICAL DATA:  Preoperative respiratory evaluation prior to left shoulder surgery due to advanced glenohumeral osteoarthritis. Current smoker for 20+ years. Current history of sleep apnea.  EXAM: CHEST  2 VIEW  COMPARISON:  None.  FINDINGS: Cardiac silhouette normal in size. Thoracic aorta mildly tortuous. Hilar and mediastinal contours otherwise unremarkable. Mildly prominent bronchovascular markings diffusely and mild to moderate central peribronchial thickening. Lungs otherwise clear. No localized airspace consolidation. No pleural effusions. No pneumothorax. Normal pulmonary vascularity. Mild degenerative changes involving the thoracic spine.  IMPRESSION: Mild to moderate changes of bronchitis and/or asthma  (acute or chronic). No acute cardiopulmonary disease otherwise.   Electronically Signed   By: Hulan Saas M.D.   On: 11/04/2014 16:04   Dg Shoulder Left Port  11/10/2014   CLINICAL DATA:  Status post left shoulder arthroplasty.  EXAM: LEFT SHOULDER - 1 VIEW  COMPARISON:  Left shoulder MRI, 07/06/2014  FINDINGS: Left shoulder hemiarthroplasty appears well-seated and aligned.  There is no acute fracture or evidence of an operative complication.  IMPRESSION: Well aligned left shoulder hemiarthroplasty   Electronically Signed   By: Amie Portland M.D.   On: 11/10/2014 14:20    Disposition: Final discharge disposition not confirmed      Discharge Instructions    Call MD / Call 911    Complete by:  As directed   If you experience chest pain or shortness of breath, CALL 911 and be transported to the hospital emergency room.  If you develope a fever above 101 F, pus (white drainage) or increased drainage or redness at the wound, or calf pain, call your surgeon's office.     Constipation Prevention    Complete by:  As directed   Drink plenty of fluids.  Prune juice may be helpful.  You may use a stool softener, such as Colace (over the counter) 100 mg twice a day.  Use MiraLax (over the counter) for constipation as needed.     Diet - low sodium heart healthy    Complete by:  As directed      Increase activity slowly as tolerated    Complete by:  As directed            Follow-up Information    Follow up with Mable Paris, MD. Schedule an appointment as soon as possible for a visit in 2 weeks.   Specialty:  Orthopedic Surgery   Contact information:   [address] [place] [address] [phone]        Signed: Jiles Harold 11/11/2014, 10:12 AM

## 2014-11-11 NOTE — Discharge Instructions (Signed)

## 2014-11-11 NOTE — Progress Notes (Signed)
Occupational Therapy Treatment Patient Details Name: Paul Wright MRN: [id number] DOB: 08-Feb-1966 Today's Date: 11/11/2014    History of present illness 49 y.o male s/p Lt total shoulder arthroplasty. PMH: sleep apnea, arthritis and joint pain.   OT comments  Focus of session included dressing, bathing, and donning/doffing of sling and sling wearing schedule for LUE. Pt and pt's wife able to return safe demonstration of proper techniques. Pt provided with shoulder protocol handout to ensure compliance while engaging in ADLs.  Follow Up Recommendations  No OT follow up    Equipment Recommendations  None recommended by OT    Recommendations for Other Services      Precautions / Restrictions Precautions Precautions: Shoulder;Fall Type of Shoulder Precautions: No AROM of shoulder & no pendulums Shoulder Interventions: Shoulder sling/immobilizer;Off for dressing/bathing/exercises Precaution Booklet Issued: Yes (comment) Required Braces or Orthoses: Sling Restrictions Weight Bearing Restrictions: Yes LUE Weight Bearing: Non weight bearing       Mobility Bed Mobility Overal bed mobility: Needs Assistance Bed Mobility: Supine to Sit     Supine to sit: Supervision;HOB elevated     General bed mobility comments: Pt in chair upon arrival  Transfers Overall transfer level: Needs assistance Equipment used: None Transfers: Sit to/from Stand Sit to Stand: Supervision              Balance Overall balance assessment: Needs assistance Sitting-balance support: No upper extremity supported;Feet supported Sitting balance-Leahy Scale: Good     Standing balance support: No upper extremity supported;During functional activity Standing balance-Leahy Scale: Good                     ADL Overall ADL's : Needs assistance/impaired Eating/Feeding: Set up;Sitting      Upper Body Bathing: Modified independent;Sitting    Upper Body Dressing : Modified independent  ;Sitting   Lower Body Dressing: Modified independent ;Sit to/from stand    Functional mobility during ADLs: Min guard General ADL Comments: Pt and pt's wife educated in compensatory techniques for dressing and bathing      Vision                     Perception     Praxis      Cognition   Behavior During Therapy: WFL for tasks assessed/performed Overall Cognitive Status: Within Functional Limits for tasks assessed                       Extremity/Trunk Assessment  Upper Extremity Assessment Upper Extremity Assessment: LUE deficits/detail LUE Deficits / Details: Lt TSA (PROM) LUE Coordination: decreased gross motor   Lower Extremity Assessment Lower Extremity Assessment: Defer to PT evaluation   Cervical / Trunk Assessment Cervical / Trunk Assessment: Normal    Exercises    Shoulder Instructions Shoulder Instructions Donning/doffing shirt without moving shoulder: Minimal assistance Donning/doffing sling/immobilizer: Min-guard Correct positioning of sling/immobilizer: Supervision/safety ROM for elbow, wrist and digits of operated UE: Supervision/safety Sling wearing schedule (on at all times/off for ADL's): Independent Proper positioning of operated UE when showering: Min-guard Positioning of UE while sleeping: Min-guard     General Comments      Pertinent Vitals/ Pain       Pain Assessment: 0-10 Pain Score: 4  Pain Location: Lt shoulder Pain Descriptors / Indicators: Sore Pain Intervention(s): Repositioned  Home Living Family/patient expects to be discharged to:: Private residence Living Arrangements: Spouse/significant other Available Help at Discharge: Family;Available 24 hours/day Type of Home: House Home Access:  Stairs to enter [employer] of Steps: 4 Entrance Stairs-Rails: Right Home Layout: One level     Bathroom Shower/Tub: Tub/shower unit Shower/tub characteristics: Engineer, building services: Standard Bathroom  Accessibility: Yes How Accessible: Accessible via walker Home Equipment: Adaptive equipment;Hand held shower head Adaptive Equipment: Long-handled sponge Additional Comments: Pt drives      Prior Functioning/Environment Level of Independence: Independent            Frequency Min 2X/week     Progress Toward Goals  OT Goals(current goals can now be found in the care plan section)  Progress towards OT goals: Progressing toward goals  Acute Rehab OT Goals Patient Stated Goal: none stated by pt OT Goal Formulation: With patient Time For Goal Achievement: 11/25/14 Potential to Achieve Goals: Good ADL Goals Pt Will Perform Upper Body Bathing: with min assist;sitting Pt Will Perform Lower Body Bathing: with mod assist;sit to/from stand Pt Will Perform Upper Body Dressing: with min assist;sitting Pt Will Perform Lower Body Dressing: with mod assist;sit to/from stand Pt Will Perform Tub/Shower Transfer: Tub transfer;with min guard assist;ambulating  Plan Discharge plan remains appropriate    Co-evaluation                 End of Session Equipment Utilized During Treatment: Gait belt   Activity Tolerance Patient tolerated treatment well   Patient Left in chair;with call bell/phone within reach;with family/visitor present   Nurse Communication Weight bearing status (Lt UE NWB)        Time: 4098-1191 OT Time Calculation (min): 25 min  Charges: OT General Charges $OT Visit: 1 Procedure OT Evaluation $Initial OT Evaluation Tier I: 1 Procedure OT Treatments $Self Care/Home Management : 8-22 mins  Smiley Houseman 11/11/2014, 9:46 AM

## 2014-11-11 NOTE — Evaluation (Signed)
Occupational Therapy Evaluation Patient Details Name: Paul Wright MRN: [id number] DOB: January 07, 1966 Today's Date: 11/11/2014    History of Present Illness 49 y.o male s/p Lt total shoulder arthroplasty. PMH: sleep apnea, arthritis and joint pain.   Clinical Impression   Pt admitted to hospital due to reason stated above. Pt currently with functional limitiations due to the deficits listed below (see OT problem list). Pt prior to admission was independent with ADLs/IADLs. Pt currently requires set up to moderate assistance for safety with ADLs. Pt's wife present during education for PROM exercise and able to return safe demonstration. Pt will benefit from skilled OT to increase independence and safety with ADLs and balance to allow safe discharge home.    Follow Up Recommendations  No OT follow up    Equipment Recommendations  None recommended by OT    Recommendations for Other Services       Precautions / Restrictions Precautions Precautions: Shoulder;Fall Shoulder Interventions: Shoulder sling/immobilizer Required Braces or Orthoses: Sling Restrictions Weight Bearing Restrictions: Yes LUE Weight Bearing: Non weight bearing      Mobility Bed Mobility Overal bed mobility: Needs Assistance Bed Mobility: Supine to Sit     Supine to sit: Supervision;HOB elevated     General bed mobility comments: Pt has adequate trunk strength necessary for bed mobility  Transfers Overall transfer level: Needs assistance Equipment used: None Transfers: Sit to/from Stand Sit to Stand: Supervision              Balance Overall balance assessment: Needs assistance Sitting-balance support: No upper extremity supported;Feet supported Sitting balance-Leahy Scale: Good     Standing balance support: No upper extremity supported Standing balance-Leahy Scale: Good                              ADL Overall ADL's : Needs assistance/impaired Eating/Feeding: Set  up;Sitting   Grooming: Set up;Standing   Upper Body Bathing: Minimal assitance;Sitting   Lower Body Bathing: Moderate assistance;Sit to/from stand   Upper Body Dressing : Minimal assistance;Sitting   Lower Body Dressing: Moderate assistance;Sit to/from stand   Toilet Transfer: Ambulation;Comfort height toilet;Supervision/safety   Toileting- Clothing Manipulation and Hygiene: Minimal assistance;Sit to/from stand       Functional mobility during ADLs: Min guard General ADL Comments:      Vision     Perception     Praxis      Pertinent Vitals/Pain Pain Assessment: 0-10 Pain Score: 4  Pain Location: Lt shoulder Pain Descriptors / Indicators: Sore     Hand Dominance Right   Extremity/Trunk Assessment Upper Extremity Assessment Upper Extremity Assessment: LUE deficits/detail LUE Deficits / Details: Lt TSA (PROM) LUE Coordination: decreased gross motor   Lower Extremity Assessment Lower Extremity Assessment: Defer to PT evaluation   Cervical / Trunk Assessment Cervical / Trunk Assessment: Normal   Communication Communication Communication: No difficulties   Cognition Arousal/Alertness: Awake/alert Behavior During Therapy: WFL for tasks assessed/performed Overall Cognitive Status: Within Functional Limits for tasks assessed                     General Comments       Exercises Exercises: General Upper Extremity     Shoulder Instructions Shoulder Instructions Donning/doffing sling/immobilizer: Minimal assistance Correct positioning of sling/immobilizer: Set-up ROM for elbow, wrist and digits of operated UE: Supervision/safety Sling wearing schedule (on at all times/off for ADL's): Independent Proper positioning of operated UE when showering: Min-guard Positioning of  UE while sleeping: Min-guard    Home Living Family/patient expects to be discharged to:: Private residence Living Arrangements: Spouse/significant other Available Help at Discharge:  Family;Available 24 hours/day Type of Home: House Home Access: Stairs to enter [employer] of Steps: 4 Entrance Stairs-Rails: Right Home Layout: One level     Bathroom Shower/Tub: Tub/shower unit Shower/tub characteristics: Engineer, building services: Standard Bathroom Accessibility: Yes How Accessible: Accessible via walker Home Equipment: Adaptive equipment;Hand held shower head Adaptive Equipment: Long-handled sponge Additional Comments: Pt drives      Prior Functioning/Environment Level of Independence: Independent             OT Diagnosis: Generalized weakness;Acute pain   OT Problem List: Decreased strength;Decreased range of motion;Pain   OT Treatment/Interventions:      OT Goals(Current goals can be found in the care plan section) Acute Rehab OT Goals Patient Stated Goal: to return home OT Goal Formulation: With patient Time For Goal Achievement: 11/25/14 Potential to Achieve Goals: Good ADL Goals Pt Will Perform Upper Body Bathing: with min assist;sitting Pt Will Perform Lower Body Bathing: with mod assist;sit to/from stand Pt Will Perform Upper Body Dressing: with min assist;sitting Pt Will Perform Lower Body Dressing: with mod assist;sit to/from stand Pt Will Perform Tub/Shower Transfer: Tub transfer;with min guard assist;ambulating  OT Frequency:     Barriers to D/C:            Co-evaluation              End of Session Equipment Utilized During Treatment: Gait belt Nurse Communication: Weight bearing status (Lt UE NWB)  Activity Tolerance: Patient tolerated treatment well Patient left: in chair;with call bell/phone within reach;with family/visitor present   Time: 1610-9604 OT Time Calculation (min): 36 min Charges:    G-Codes:    Smiley Houseman 19-Nov-2014, 9:35 AM

## 2014-11-24 ENCOUNTER — Encounter: Payer: 59 | Admitting: Family Medicine

## 2014-11-25 ENCOUNTER — Encounter: Payer: 59 | Admitting: Family Medicine

## 2016-06-18 ENCOUNTER — Encounter: Payer: Self-pay | Admitting: Orthopedic Surgery

## 2016-06-18 ENCOUNTER — Ambulatory Visit (INDEPENDENT_AMBULATORY_CARE_PROVIDER_SITE_OTHER): Payer: 59 | Admitting: Orthopedic Surgery

## 2016-06-18 ENCOUNTER — Ambulatory Visit (INDEPENDENT_AMBULATORY_CARE_PROVIDER_SITE_OTHER): Payer: 59

## 2016-06-18 VITALS — BP 128/87 | HR 83 | Wt 306.0 lb

## 2016-06-18 DIAGNOSIS — M25562 Pain in left knee: Secondary | ICD-10-CM

## 2016-06-18 DIAGNOSIS — G8929 Other chronic pain: Secondary | ICD-10-CM

## 2016-06-18 DIAGNOSIS — M1712 Unilateral primary osteoarthritis, left knee: Secondary | ICD-10-CM

## 2016-06-18 MED ORDER — DICLOFENAC POTASSIUM 50 MG PO TABS: 50.0000 mg | ORAL_TABLET | Freq: Two times a day (BID) | ORAL | 3 refills | Status: DC

## 2016-06-18 NOTE — Patient Instructions (Signed)
Brace for activity  You have received an injection of steroids into the joint. 15% of patients will have increased pain within the 24 hours postinjection.   This is transient and will go away.   We recommend that you use ice packs on the injection site for 20 minutes every 2 hours and extra strength Tylenol 2 tablets every 8 as needed until the pain resolves.  If you continue to have pain after taking the Tylenol and using the ice please call the office for further instructions.   Take arthritis med twice a day for 6 weeks

## 2016-06-18 NOTE — Progress Notes (Signed)
NEW PATIENT OFFICE VISIT    Chief Complaint  Patient presents with  . Knee Pain    left knee pain    51 year old male with a history of ulcer arthritis of his left knee got an injection several years ago presents with recurrent dull intermittent activity related medial left knee pain no treatment    Review of Systems  Constitutional: Negative for fever.  Musculoskeletal: Negative for joint pain.  Skin: Negative for rash.    Past Medical History:  Diagnosis Date  . Arthritis    "left shoulder" (11/10/2014)  . Joint pain   . OSA on CPAP     Past Surgical History:  Procedure Laterality Date  . APPENDECTOMY  ~ 2010  . JOINT REPLACEMENT    . KNEE ARTHROSCOPY Left ~ 2006  . TOTAL SHOULDER ARTHROPLASTY Left 11/10/2014  . TOTAL SHOULDER ARTHROPLASTY Left 11/10/2014   Procedure: TOTAL SHOULDER ARTHROPLASTY;  Surgeon: Jones Broom, MD;  Location: [facility] OR;  Service: Orthopedics;  Laterality: Left;  Left total shoulder arthroplasty    Family History  Problem Relation Age of Onset  . Arthritis    . Heart disease Mother   . Diabetes Mother   . Heart attack Father   . Graves' disease Sister   . Hypothyroidism Brother   . Hypertension Maternal Aunt   . Diabetes Sister   . Heart disease Sister   . Heart disease Sister   . Heart disease Brother   . Diabetes Brother    Social History  Substance Use Topics  . Smoking status: Current Every Day Smoker    Packs/day: 0.75    Years: 27.00    Types: Cigarettes  . Smokeless tobacco: Former Neurosurgeon    Types: Chew  . Alcohol use Yes     Comment: 11/10/2014 "I've had 1 beer in the last 3 yrs"    BP 128/87   Pulse 83   Wt (!) 306 lb (138.8 kg)   BMI 36.29 kg/m   Physical Exam  Constitutional: He appears well-developed and well-nourished.  Vital signs have been reviewed and are stable. Gen. appearance the patient is well-developed and well-nourished with normal grooming and hygiene. The patient is oriented 3 with normal mood and  affect.  Vitals reviewed.   Ortho Exam   Right knee examination Inspection no tenderness or swelling Normal range of motion Normal strength Normal ligament exam Normal skin Normal sensation Normal pulse without edema  Left knee exam Medial joint line tenderness small effusion Flexion arc of 115 Muscle tone no atrophy normal strength in the quadriceps Ligament exam exam is normal Skin is normal Sensory exam is normal Pulse normal no edema    Meds ordered this encounter  Medications  . diclofenac (CATAFLAM) 50 MG tablet    Sig: Take 1 tablet (50 mg total) by mouth 2 (two) times daily.    Dispense:  90 tablet    Refill:  3    Encounter Diagnoses  Name Primary?  . Chronic pain of left knee   . Primary osteoarthritis of left knee Yes     PLAN:   Imaging shows osteoarthritis medial compartment as well as lateral compartment  Meds ordered this encounter  Medications  . diclofenac (CATAFLAM) 50 MG tablet    Sig: Take 1 tablet (50 mg total) by mouth 2 (two) times daily.    Dispense:  90 tablet    Refill:  3   Procedure note left knee injection verbal consent was obtained to inject left knee  joint  Timeout was completed to confirm the site of injection  The medications used were 40 mg of Depo-Medrol and 1% lidocaine 3 cc  Anesthesia was provided by ethyl chloride and the skin was prepped with alcohol.  After cleaning the skin with alcohol a 20-gauge needle was used to inject the left knee joint. There were no complications. A sterile bandage was applied.   OA medial left knee brace

## 2016-08-27 ENCOUNTER — Encounter: Payer: Self-pay | Admitting: Family

## 2016-08-27 ENCOUNTER — Ambulatory Visit (INDEPENDENT_AMBULATORY_CARE_PROVIDER_SITE_OTHER): Payer: 59 | Admitting: Family

## 2016-08-27 VITALS — BP 123/85 | HR 126 | Temp 101.0°F | Ht 77.0 in | Wt 298.0 lb

## 2016-08-27 DIAGNOSIS — R319 Hematuria, unspecified: Secondary | ICD-10-CM | POA: Diagnosis not present

## 2016-08-27 DIAGNOSIS — N410 Acute prostatitis: Secondary | ICD-10-CM | POA: Diagnosis not present

## 2016-08-27 LAB — MICROSCOPIC EXAMINATION
BACTERIA UA: NONE SEEN
EPITHELIAL CELLS (NON RENAL): NONE SEEN /HPF (ref 0–10)
RBC MICROSCOPIC, UA: NONE SEEN /HPF (ref 0–?)
Renal Epithel, UA: NONE SEEN /hpf
WBC, UA: NONE SEEN /hpf (ref 0–?)

## 2016-08-27 LAB — URINALYSIS, COMPLETE
BILIRUBIN UA: NEGATIVE
GLUCOSE, UA: NEGATIVE
Leukocytes, UA: NEGATIVE
NITRITE UA: NEGATIVE
RBC UA: NEGATIVE
UUROB: 4 mg/dL — AB (ref 0.2–1.0)
pH, UA: 5.5 (ref 5.0–7.5)

## 2016-08-27 MED ORDER — ONDANSETRON HCL 4 MG PO TABS: 4.0000 mg | ORAL_TABLET | Freq: Three times a day (TID) | ORAL | 0 refills | Status: DC | PRN

## 2016-08-27 MED ORDER — CEFTRIAXONE SODIUM 1 G IJ SOLR
1.0000 g | Freq: Once | INTRAMUSCULAR | Status: AC
  Administered 2016-08-27: 1 g via INTRAMUSCULAR

## 2016-08-27 MED ORDER — LEVOFLOXACIN 500 MG PO TABS: 500.0000 mg | ORAL_TABLET | Freq: Every day | ORAL | 0 refills | Status: DC

## 2016-08-27 NOTE — Patient Instructions (Signed)
Prostatitis Prostatitis is swelling or inflammation of the prostate gland. The prostate is a walnut-sized gland that is involved in the production of semen. It is located below a man's bladder, in front of the rectum. There are four types of prostatitis:  Chronic nonbacterial prostatitis. This is the most common type of prostatitis. It may be associated with a viral infection or autoimmune disorder.  Acute bacterial prostatitis. This is the least common type of prostatitis. It starts quickly and is usually associated with a bladder infection, high fever, and shaking chills. It can occur at any age.  Chronic bacterial prostatitis. This type usually results from acute bacterial prostatitis that happens repeatedly (is recurrent) or has not been treated properly. It can occur in men of any age but is most common among middle-aged men whose prostate has begun to get larger. The symptoms are not as severe as symptoms caused by acute bacterial prostatitis.  Prostatodynia or chronic pelvic pain syndrome (CPPS). This type is also called pelvic floor disorder. It is associated with increased muscular tone in the pelvis surrounding the prostate. What are the causes? Bacterial prostatitis is caused by infection from bacteria. Chronic nonbacterial prostatitis may be caused by:  Urinary tract infections (UTIs).  Nerve damage.  A response by the body's disease-fighting system (autoimmune response).  Chemicals in the urine. The causes of the other types of prostatitis are usually not known. What are the signs or symptoms? Symptoms of this condition vary depending upon the type of prostatitis. If you have acute bacterial prostatitis, you may experience:  Urinary symptoms, such as:  Painful urination.  Burning during urination.  Frequent and sudden urges to urinate.  Inability to start urinating.  A weak or interrupted stream of urine.  Vomiting.  Nausea.  Fever.  Chills.  Inability to  empty the bladder completely.  Pain in the:  Muscles or joints.  Lower back.  Lower abdomen. If you have any of the other types of prostatitis, you may experience:  Urinary symptoms, such as:  Sudden urges to urinate.  Frequent urination.  Difficulty starting urination.  Weak urine stream.  Dribbling after urination.  Discharge from the urethra. The urethra is a tube that opens at the end of the penis.  Pain in the:  Testicles.  Penis or tip of the penis.  Rectum.  Area in front of the rectum and below the scrotum (perineum).  Problems with sexual function.  Painful ejaculation.  Bloody semen. How is this diagnosed? This condition may be diagnosed based on:  A physical and medical exam.  Your symptoms.  A urine test to check for bacteria.  An exam in which a health care provider uses a finger to feel the prostate (digital rectal exam).  A test of a sample of semen.  Blood tests.  Ultrasound.  Removal of prostate tissue to be examined under a microscope (biopsy).  Tests to check how your body handles urine (urodynamic tests).  A test to look inside your bladder or urethra (cystoscopy). How is this treated? Treatment for this condition depends on the type of prostatitis. Treatment may involve:  Medicines to relieve pain or inflammation.  Medicines to help relax your muscles.  Physical therapy.  Heat therapy.  Techniques to help you control certain body functions (biofeedback).  Relaxation exercises.  Antibiotic medicine, if your condition is caused by bacteria.  Warm water baths (sitz baths). Sitz baths help with relaxing your pelvic floor muscles, which helps to relieve pressure on the prostate. Follow   these instructions at home:  Take over-the-counter and prescription medicines only as told by your health care provider.  If you were prescribed an antibiotic, take it as told by your health care provider. Do not stop taking the  antibiotic even if you start to feel better.  If physical therapy, biofeedback, or relaxation exercises were prescribed, do exercises as instructed.  Take sitz baths as directed by your health care provider. For a sitz bath, sit in warm water that is deep enough to cover your hips and buttocks.  Keep all follow-up visits as told by your health care provider. This is important. Contact a health care provider if:  Your symptoms get worse.  You have a fever. Get help right away if:  You have chills.  You feel nauseous.  You vomit.  You feel light-headed or feel like you are going to faint.  You are unable to urinate.  You have blood or blood clots in your urine. This information is not intended to replace advice given to you by your health care provider. Make sure you discuss any questions you have with your health care provider. Document Released: 02/02/2000 Document Revised: 10/26/2015 Document Reviewed: 10/26/2015 Elsevier Interactive Patient Education  2017 [employer]  

## 2016-08-27 NOTE — Progress Notes (Addendum)
   Subjective:    Patient ID: Paul PatrickKenneth D Flannagan, male    DOB: 1966/02/07, 51 y.o.   MRN: [id number]  Hematuria  This is a new problem. The current episode started today. The problem is unchanged. The hematuria occurs during the initial portion of his urinary stream. He reports clotting at the end of his urine stream. His pain is at a severity of 9/10. The pain is mild. Irritative symptoms include frequency and urgency. Obstructive symptoms include straining. Associated symptoms include abdominal pain, chills, dysuria, fever, flank pain, hesitancy, inability to urinate and nausea. Pertinent negatives include no vomiting.      Review of Systems  Constitutional: Positive for chills and fever.  Gastrointestinal: Positive for abdominal pain and nausea. Negative for vomiting.  Genitourinary: Positive for dysuria, flank pain, frequency, hematuria, hesitancy and urgency.  All other systems reviewed and are negative.      Objective:   Physical Exam  Constitutional: He is oriented to person, place, and time. He appears well-developed and well-nourished. No distress.  HENT:  Head: Normocephalic.  Eyes: Pupils are equal, round, and reactive to light.  Cardiovascular: Regular rhythm, normal heart sounds and intact distal pulses.  Tachycardia present.   No murmur heard. Pulmonary/Chest: Effort normal and breath sounds normal. No respiratory distress. He has no wheezes.  Abdominal: Soft. Bowel sounds are normal. He exhibits no distension. There is tenderness (lower abd).  Musculoskeletal: Normal range of motion. He exhibits no edema or tenderness.  Neurological: He is alert and oriented to person, place, and time.  Skin: Skin is warm and dry. No rash noted. No erythema.  Psychiatric: He has a normal mood and affect. His behavior is normal. Judgment and thought content normal.  Vitals reviewed.     BP 123/85   Pulse (!) 126   Temp (!) 101 F (38.3 C) (Oral)   Ht 6\' 5"  (1.956 m)   Wt 298 lb  (135.2 kg)   BMI 35.34 kg/m      Assessment & Plan:  1. Hematuria, unspecified type - Urinalysis, Complete - levofloxacin (LEVAQUIN) 500 MG tablet; Take 1 tablet (500 mg total) by mouth daily.  Dispense: 21 tablet; Refill: 0  2. Acute prostatitis - levofloxacin (LEVAQUIN) 500 MG tablet; Take 1 tablet (500 mg total) by mouth daily.  Dispense: 21 tablet; Refill: 0 - cefTRIAXone (ROCEPHIN) injection 1 g; Inject 1 g into the muscle once.  Start antibiotic today Worrisome for sepsis, BP stable today but tachy RTO in 08/30/16, if symptoms worsen call or go to ED Force fluids  Jannifer Rodneyhristy Jaliza Seifried, FNP

## 2016-08-27 NOTE — Addendum Note (Signed)
Addended by: Jannifer RodneyHAWKS, Brunette Lavalle A on: 08/27/2016 05:10 PM   Modules accepted: Orders

## 2016-08-30 ENCOUNTER — Ambulatory Visit: Payer: 59 | Admitting: Family

## 2016-09-21 IMAGING — MR MR SHOULDER*L* W/O CM
5 of 7 series · 25 of 40 positions shown · non-contrast
Comparison: Plain films left shoulder 05/09/2014

CLINICAL DATA: Chronic left shoulder pain. Patient reports history
prior dislocation.

EXAM:
MRI OF THE LEFT SHOULDER WITHOUT CONTRAST
TECHNIQUE: Multiplanar, multisequence MR imaging of the shoulder was performed.
No intravenous contrast was administered.

[Series 3: t2fs axial · axial · 3.0mm · 0.35mm/px · z∈[-69,+31]mm · 5 of 30 slices shown]
[im 1/30]
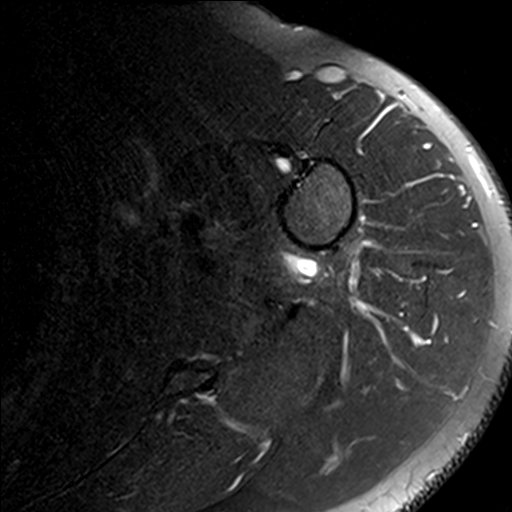
[im 8/30]
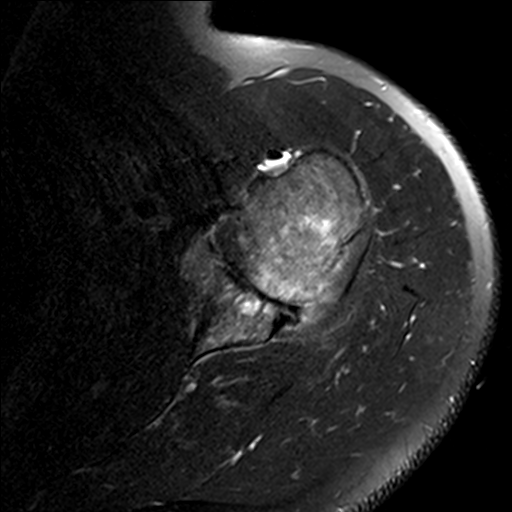
[im 15/30]
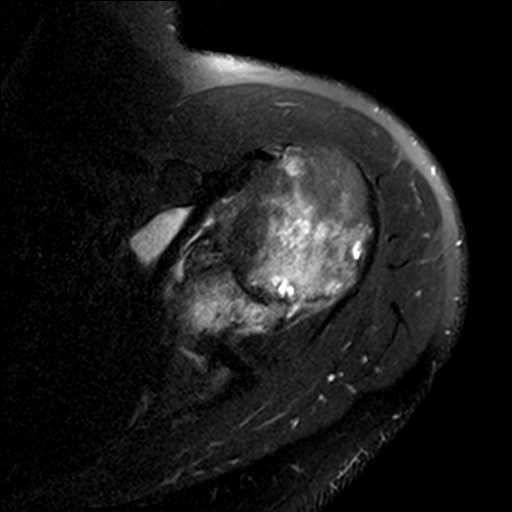
[im 22/30]
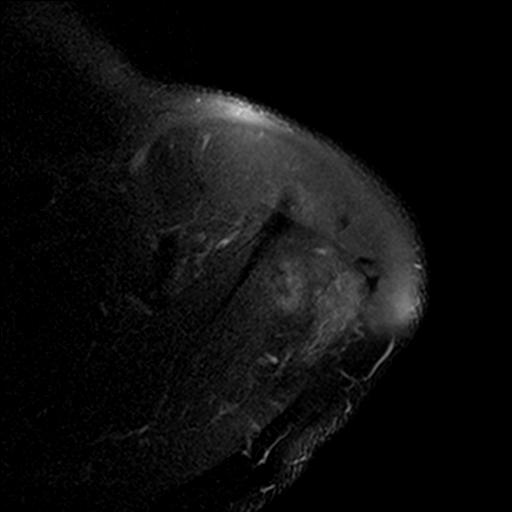
[im 30/30]
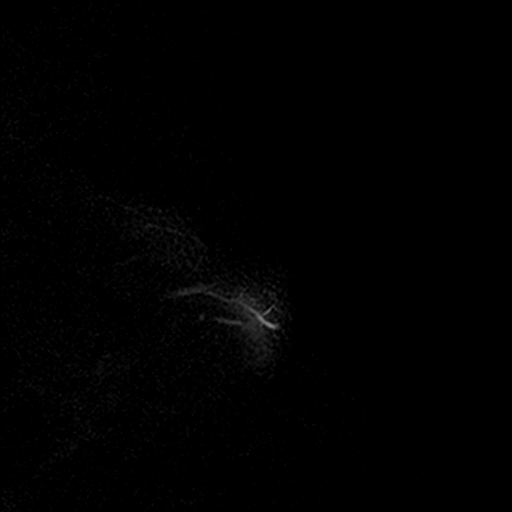

[Series 4: t2fs coronal · oblique · 3.0mm · 0.31mm/px · 5 of 24 slices shown]
[im 1/24]
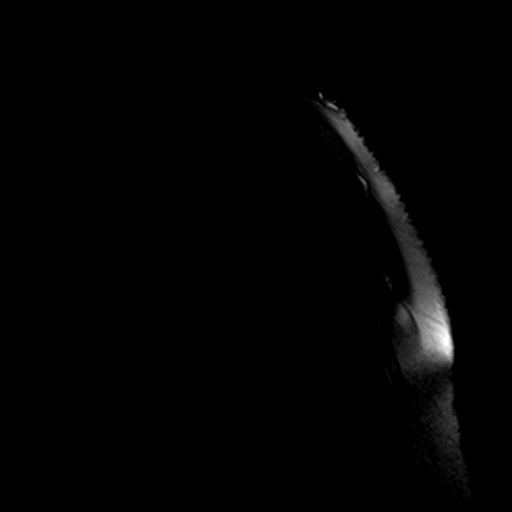
[im 6/24]
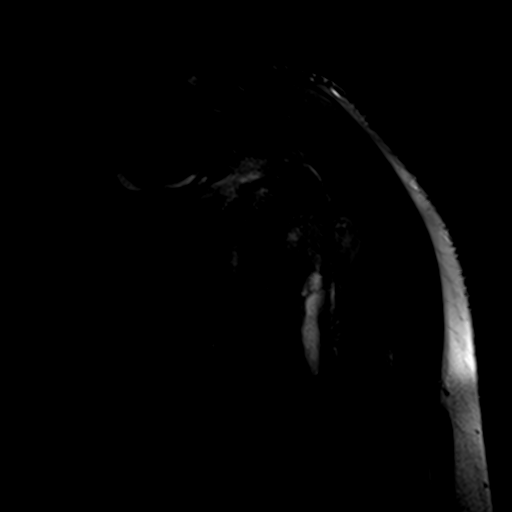
[im 12/24]
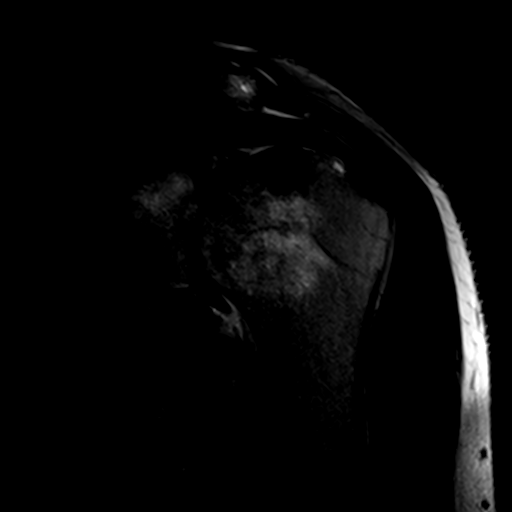
[im 18/24]
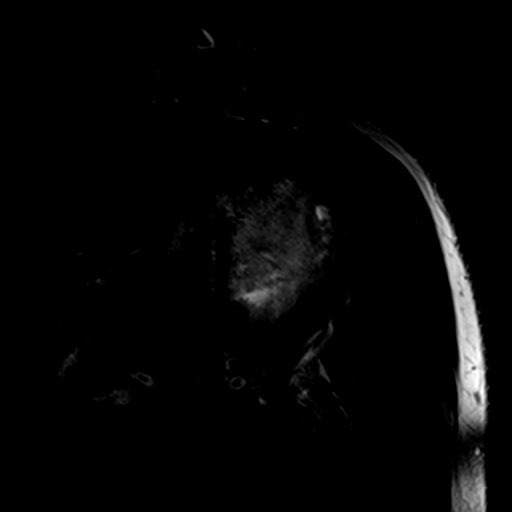
[im 24/24]
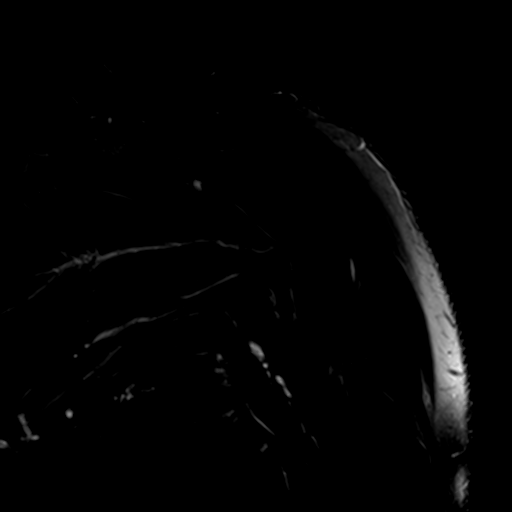

[Series 5: pd_blade_cor · oblique · 3.0mm · 0.62mm/px · 5 of 24 slices shown]
[im 1/24]
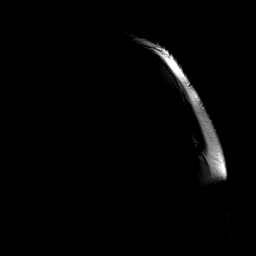
[im 6/24]
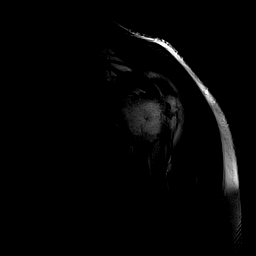
[im 12/24]
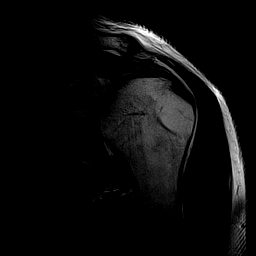
[im 18/24]
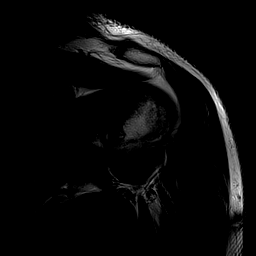
[im 24/24]
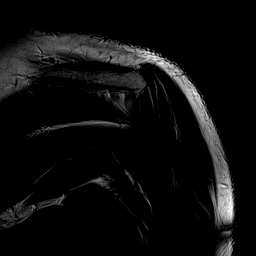

[Series 6: T1 · oblique · 3.0mm · 0.31mm/px · 7 of 34 slices shown]
[im 1/34]
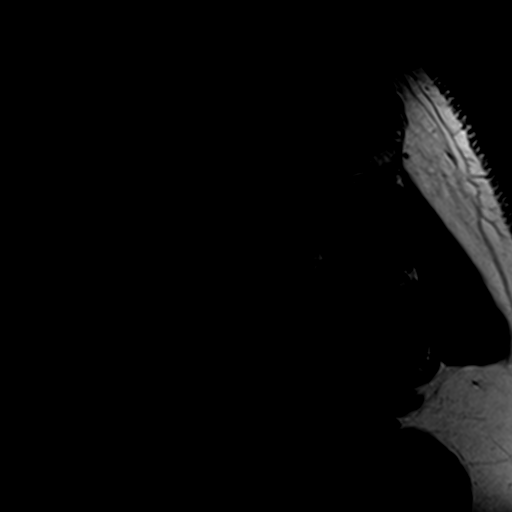
[im 6/34]
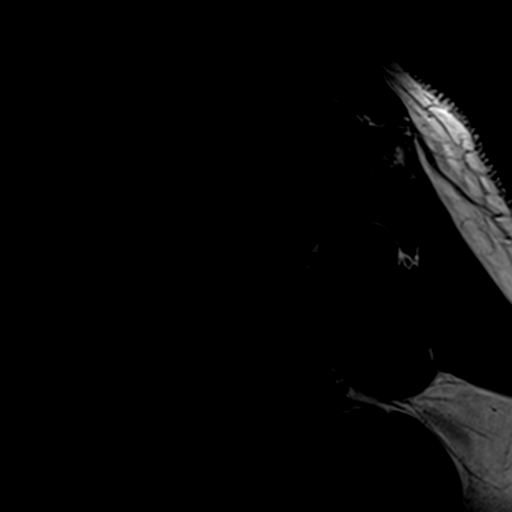
[im 12/34]
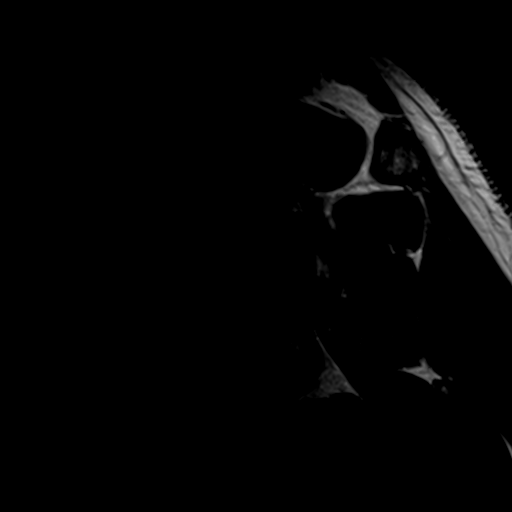
[im 17/34]
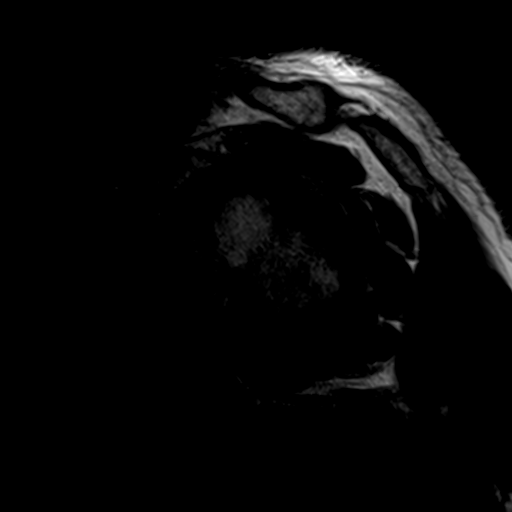
[im 23/34]
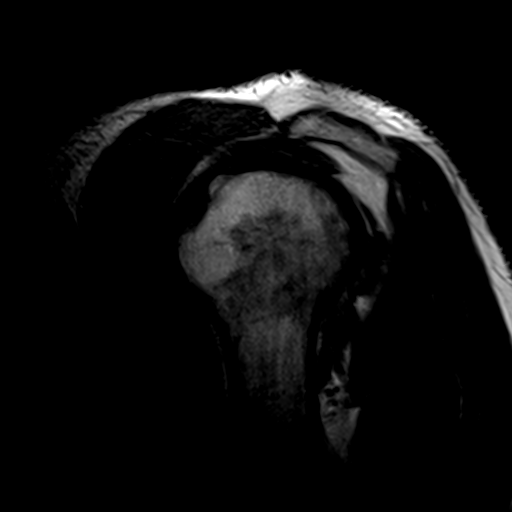
[im 28/34]
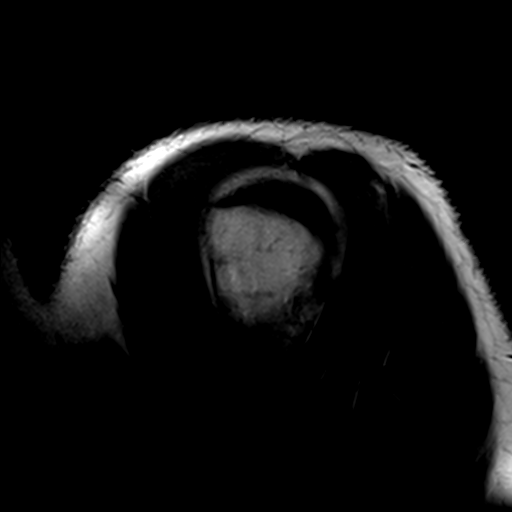
[im 34/34]
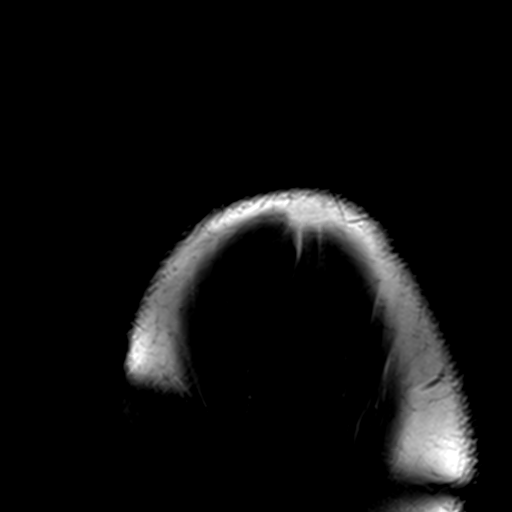

[Series 7: t2fs_blade_sag · oblique · 3.0mm · 0.62mm/px · 3 of 34 slices shown]
[im 1/34]
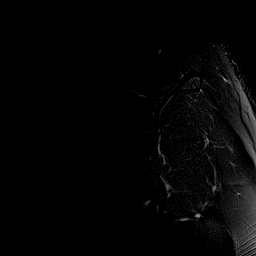
[im 6/34]
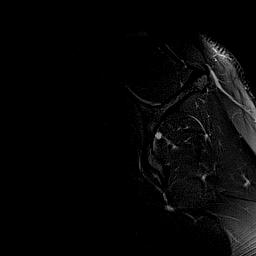
[im 12/34]
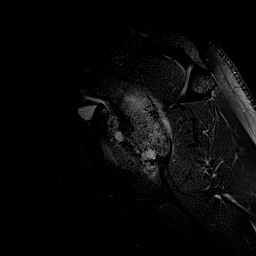

[25 of 40 positions shown; findings below may reference images not displayed]

FINDINGS: Rotator cuff: Mild tendinosis of the supraspinatus is identified.
The rotator cuff is intact and otherwise unremarkable.

Muscles:  Normal in appearance without atrophy or focal lesion.

Biceps long head:  Intact.

Acromioclavicular Joint: Mild to moderate degenerative change is
seen.

Glenohumeral Joint: The patient has severe glenohumeral
osteoarthritis with bone-on-bone joint space narrowing, subchondral
sclerosis and subchondral cyst formation. An osteophyte is seen off
the humeral head.

Labrum:  Diffusely degenerated and torn.

Bones: No fracture or worrisome marrow lesion. The acromion is type
3. There is no Hill-Sachs lesion or bony Bankart to suggest prior
dislocation.
IMPRESSION: Dominant finding is markedly advanced for age glenohumeral
osteoarthritis.

Mild to moderate acromioclavicular osteoarthritis.

Mild appearing supraspinatus tendinopathy without tear.

## 2016-10-10 DIAGNOSIS — K5720 Diverticulitis of large intestine with perforation and abscess without bleeding: Secondary | ICD-10-CM | POA: Insufficient documentation

## 2016-10-10 DIAGNOSIS — K37 Unspecified appendicitis: Secondary | ICD-10-CM | POA: Insufficient documentation

## 2016-11-06 ENCOUNTER — Encounter (INDEPENDENT_AMBULATORY_CARE_PROVIDER_SITE_OTHER): Payer: Self-pay | Admitting: Internal Medicine

## 2016-11-06 ENCOUNTER — Ambulatory Visit (INDEPENDENT_AMBULATORY_CARE_PROVIDER_SITE_OTHER): Payer: 59 | Admitting: Internal Medicine

## 2016-11-06 DIAGNOSIS — K5732 Diverticulitis of large intestine without perforation or abscess without bleeding: Secondary | ICD-10-CM | POA: Diagnosis not present

## 2016-11-06 NOTE — Patient Instructions (Signed)
OV in 2 weeks after visit with Dr. Marcha Solders

## 2016-11-06 NOTE — Progress Notes (Signed)
Subjective:    Patient ID: Paul Wright, male    DOB: Nov 19, 1965, 51 y.o.   MRN: [id number]  HPI Referred by PCP Dr. Christell Constant for diverticulitis.  Admitted to [facility] with acute diverticulitis with localized perforation and question of abscess. Has had 2 admission at [facility]. One in July and one in September.  Symptoms included abdominal pain, fever, nausea. Wife states was septic. Admitted x 7 days in July. Admitted again this month for same. In September he was seen by Dr. Marcha Solders. In July, he was followed by Dr. Gabriel Cirri. Dr. Catalina Gravel proceeded with a colonoscopy in August. He tells me between July and August he returned to work full time.  He works as a Freight forwarder.  His next appt will be in 2 weeks with Dr. Marcha Solders.  Wife states Dr. Marcha Solders ? surgery His appetite is okay.   He states today he just has a weird feeling in his abdomen. He denies any abdominal pain. He is on a low residue.  He feels 80% better. Having a BM x 1 a day. Has seen blood after a BM. Is not taking NSAIDS   10/04/2016 Colonoscopy with biopsy: Dr. Gabriel Cirri. Normal exam. Good prep. One polyp. Hyperplastic.  11/04/2016 CT abdomen/pelvis with CM: abdominal pain Again seen is prominent wall thickening of the distal ileum with adjacent inflammatory changes. A few small foci of extraluminal air are again see, and possibly communicate with a sigmoid diverticulum, raising the possibility of perforated diverticulitis with reactive inflammatory changes of the adjacent ileum.  Alternatively, the ileum may remain the primary site of inflammatory pathology.  The majority of pneumoperitoneum has resolved. No extravasation of oral contrast. No drain able fluid collection. Hepatic steatosis.   10/31/2016 CT abdomen/pelvis with CM: There is wall thickening and significant associated inflammation involving the distal small bowel, sparing the terminal ileum. This resides in the rt mid to lower pelvis. There is evidence  of bowel perforation with small bubbles of adjacent extraluminal and free intraperitoneal air. No abscess. This may be infectious or inflammatory or infectious in nature   09/02/2016 CT abdomen/pelvis with CM: Progression of inflammation in the pelvis from sigmoid diverticulitis. Foci of micro perforation noted. Abscess in the anterior rt pelvis at the level of the diverticulitis measures 7.7 x 5.4x 50, slightly large than on prior study with significantly more air within the abscess currently.  Extensive inflammation of loops of mid and distal ileum, felt to be a consequence of the diverticulitis and pelvic abscess.Inflammation extends to the level of the terminal ileum.   08/28/2016 CT abdomen/pelvis with CM: Acute sigmoid diverticulitis. No free air. Secondary inflammatory changes involving a loop of small bowel in the midline lower pelvis.  Localized perforation with small foci of mesenteric gas along the sigmoid mesocolon.  Adjacent fluid collection  along the rt lower pelvis, favored to reflect an early/developing abscess. No free air.   Review of Systems Past Medical History:  Diagnosis Date  . Arthritis    "left shoulder" (11/10/2014)  . Joint pain   . OSA on CPAP     Past Surgical History:  Procedure Laterality Date  . APPENDECTOMY  ~ 2010  . JOINT REPLACEMENT    . KNEE ARTHROSCOPY Left ~ 2006  . TOTAL SHOULDER ARTHROPLASTY Left 11/10/2014  . TOTAL SHOULDER ARTHROPLASTY Left 11/10/2014   Procedure: TOTAL SHOULDER ARTHROPLASTY;  Surgeon: Jones Broom, MD;  Location: [facility] OR;  Service: Orthopedics;  Laterality: Left;  Left total shoulder  arthroplasty    No Known Allergies  No current outpatient prescriptions on file prior to visit.   No current facility-administered medications on file prior to visit.    Current Outpatient Prescriptions  Medication Sig Dispense Refill  . Ertapenem Sodium (INVANZ IV) Inject into the vein daily.     No current facility-administered medications  for this visit.          Objective:   Physical Exam Blood pressure 122/80, pulse 72, temperature 97.8 F (36.6 C), height  (1.956 m), weight 292 lb 8 oz (132.7 kg). Alert and oriented. Skin warm and dry. Oral mucosa is moist.   . Sclera anicteric, conjunctivae is pink. Thyroid not enlarged. No cervical lymphadenopathy. Lungs clear. Heart regular rate and rhythm.  Abdomen is soft. Bowel sounds are positive. No hepatomegaly. No abdominal masses felt. Slight tenderness. Rt mid abdomen.  No edema to lower extremities.           Assessment & Plan:   Complicated Diverticulitis with perforation. Keep appt with Dr. Marcha Solders.  Continue IV antibiotics. OV in 2 weeks. Discussed with Dr Karilyn Cota.

## 2016-11-07 ENCOUNTER — Encounter (INDEPENDENT_AMBULATORY_CARE_PROVIDER_SITE_OTHER): Payer: Self-pay | Admitting: *Deleted

## 2016-11-07 NOTE — Telephone Encounter (Signed)
This encounter was created in error - please disregard.

## 2016-11-20 ENCOUNTER — Ambulatory Visit (INDEPENDENT_AMBULATORY_CARE_PROVIDER_SITE_OTHER): Payer: 59 | Admitting: Internal Medicine

## 2016-11-20 ENCOUNTER — Encounter (INDEPENDENT_AMBULATORY_CARE_PROVIDER_SITE_OTHER): Payer: Self-pay | Admitting: Internal Medicine

## 2016-11-20 VITALS — BP 130/80 | HR 80 | Ht 73.0 in | Wt 295.4 lb

## 2016-11-20 DIAGNOSIS — K5732 Diverticulitis of large intestine without perforation or abscess without bleeding: Secondary | ICD-10-CM

## 2016-11-20 NOTE — Progress Notes (Signed)
Subjective:    Patient ID: Paul Wright, male    DOB: 08-18-1965, 51 y.o.   MRN: [id number]  HPIJERAMYAH GOODPASTUREfor f/u. Seen in September for f/u.  Admitted the [facility] with acute diveritculitis with localized perforation and question abscess.  He was admitted x 2. Once in July and once in September.  He underwent a colonoscopy in August which was normal. One polyp which was hyperplastic.  Is being followed by Dr. Marcha Solders.  Saw Dr. Marcha Solders 11/18/2016. States he was told he probably did not received enough medication for the 1st bout of diverticulitis is why he probably relapsed. He states he has a f/u visit in 3 weeks. He says he is doing okay. He feels tired. He has a dull ache in his lower abdomen but it is not a pain. No nausea or vomiting. BMs are moving okay.  His appetite is good.   11/04/2016 CT abdomen/pelvis with CM: abdominal pain Again seen is prominent wall thickening of the distal ileum with adjacent inflammatory changes. A few small foci of extraluminal air are again see, and possibly communicate with a sigmoid diverticulum, raising the possibility of perforated diverticulitis with reactive inflammatory changes of the adjacent ileum.  Alternatively, the ileum may remain the primary site of inflammatory pathology.  The majority of pneumoperitoneum has resolved. No extravasation of oral contrast. No drain able fluid collection. Hepatic steatosis.   10/31/2016 CT abdomen/pelvis with CM: There is wall thickening and significant associated inflammation involving the distal small bowel, sparing the terminal ileum. This resides in the rt mid to lower pelvis. There is evidence of bowel perforation with small bubbles of adjacent extraluminal and free intraperitoneal air. No abscess. This may be infectious or inflammatory or infectious in nature   09/02/2016 CT abdomen/pelvis with CM: Progression of inflammation in the pelvis from sigmoid diverticulitis. Foci of micro perforation noted. Abscess in  the anterior rt pelvis at the level of the diverticulitis measures 7.7 x 5.4x 50, slightly large than on prior study with significantly more air within the abscess currently.  Extensive inflammation of loops of mid and distal ileum, felt to be a consequence of the diverticulitis and pelvic abscess.Inflammation extends to the level of the terminal ileum.   08/28/2016 CT abdomen/pelvis with CM: Acute sigmoid diverticulitis. No free air. Secondary inflammatory changes involving a loop of small bowel in the midline lower pelvis.  Localized perforation with small foci of mesenteric gas along the sigmoid mesocolon.  Adjacent fluid collection  along the rt lower pelvis, favored to reflect an early/developing abscess. No free air.   Review of Systems     Past Medical History:  Diagnosis Date  . Arthritis    "left shoulder" (11/10/2014)  . Joint pain   . OSA on CPAP     Past Surgical History:  Procedure Laterality Date  . APPENDECTOMY  ~ 2010  . JOINT REPLACEMENT    . KNEE ARTHROSCOPY Left ~ 2006  . TOTAL SHOULDER ARTHROPLASTY Left 11/10/2014  . TOTAL SHOULDER ARTHROPLASTY Left 11/10/2014   Procedure: TOTAL SHOULDER ARTHROPLASTY;  Surgeon: Jones Broom, MD;  Location: [facility] OR;  Service: Orthopedics;  Laterality: Left;  Left total shoulder arthroplasty    No Known Allergies  Current Outpatient Prescriptions on File Prior to Visit  Medication Sig Dispense Refill  . Ertapenem Sodium (INVANZ IV) Inject into the vein daily.     No current facility-administered medications on file prior to visit.      Objective:   Physical Exam  Blood pressure 130/80, pulse 80, height  (1.854 m), weight 295 lb 6.4 oz (134 kg). Alert and oriented. Skin warm and dry. Oral mucosa is moist.   . Sclera anicteric, conjunctivae is pink. Thyroid not enlarged. No cervical lymphadenopathy. Lungs clear. Heart regular rate and rhythm.  Abdomen is soft. Bowel sounds are positive. No hepatomegaly. No abdominal masses  felt. No tenderness.  No edema to lower extremities.           Assessment & Plan:  Diverticulitis. He seems to be doing better. Very mild ache.  Keep appt with Dr. Marcha Solders.

## 2016-11-20 NOTE — Patient Instructions (Signed)
Keep appt with Dr. Marcha Solders.

## 2017-03-18 DIAGNOSIS — Z86010 Personal history of colonic polyps: Secondary | ICD-10-CM | POA: Insufficient documentation

## 2017-03-18 DIAGNOSIS — K5730 Diverticulosis of large intestine without perforation or abscess without bleeding: Secondary | ICD-10-CM | POA: Insufficient documentation

## 2017-06-10 DIAGNOSIS — Z9889 Other specified postprocedural states: Secondary | ICD-10-CM | POA: Insufficient documentation

## 2017-07-22 DIAGNOSIS — Z932 Ileostomy status: Secondary | ICD-10-CM | POA: Insufficient documentation

## 2017-07-22 DIAGNOSIS — R198 Other specified symptoms and signs involving the digestive system and abdomen: Secondary | ICD-10-CM | POA: Insufficient documentation

## 2018-10-02 ENCOUNTER — Ambulatory Visit
Admission: EM | Admit: 2018-10-02 | Discharge: 2018-10-02 | Disposition: A | Discharge status: Home / Self Care | Payer: PRIVATE HEALTH INSURANCE

## 2018-10-02 ENCOUNTER — Other Ambulatory Visit: Payer: Self-pay

## 2018-10-02 DIAGNOSIS — M67431 Ganglion, right wrist: Secondary | ICD-10-CM | POA: Diagnosis not present

## 2018-10-02 DIAGNOSIS — M25531 Pain in right wrist: Secondary | ICD-10-CM

## 2018-10-02 NOTE — Discharge Instructions (Signed)
We will hold off on draining the cyst today due to the location and proximity to the radial artery You may try wearing brace Take ibuprofen and/or tylenol as needed for pain Follow up with orthopedist for further evaluation and management Return or go to the ER if you have any new or worsening symptoms such as fever, chills, nausea, vomiting, redness, swelling, discharge, if symptoms do not improve with treatment, etc..Marland Kitchen

## 2018-10-02 NOTE — ED Triage Notes (Signed)
Has cyst on right  wrist that developed a week ago

## 2018-10-02 NOTE — ED Provider Notes (Signed)
[facility]   [id number] 10/02/18 Arrival Time: 1536  CC: Cyst  SUBJECTIVE:  Paul Wright is a 53 y.o. male who presents with a cyst to inside of RT wrist x 1 week.  Denies precipitating event or trauma.  Localizes the cyst to inside of wrist.  Describes it as mildly painful.  Has NOT tried OTC medications or bracing.  Symptoms are made worse with wrist ROM.  Reports similar symptoms in the past and had it drained.  Possible ganglion cyst.   Denies fever, chills, nausea, vomiting, erythema, swelling, SOB, chest pain, abdominal pain, changes in bowel or bladder function.    ROS: As per HPI.  All other pertinent ROS negative.     Past Medical History:  Diagnosis Date  . Arthritis    "left shoulder" (11/10/2014)  . Joint pain   . OSA on CPAP    Past Surgical History:  Procedure Laterality Date  . APPENDECTOMY  ~ 2010  . JOINT REPLACEMENT    . KNEE ARTHROSCOPY Left ~ 2006  . TOTAL SHOULDER ARTHROPLASTY Left 11/10/2014  . TOTAL SHOULDER ARTHROPLASTY Left 11/10/2014   Procedure: TOTAL SHOULDER ARTHROPLASTY;  Surgeon: Jones BroomJustin Chandler, MD;  Location: [facility] OR;  Service: Orthopedics;  Laterality: Left;  Left total shoulder arthroplasty   No Known Allergies No current facility-administered medications on file prior to encounter.    No current outpatient medications on file prior to encounter.   Social History   Socioeconomic History  . Marital status: Married    Spouse name: Not on file  . Number of children: Not on file  . Years of education: Not on file  . Highest education level: Not on file  Occupational History  . Not on file  Social Needs  . Financial resource strain: Not on file  . Food insecurity    Worry: Not on file    Inability: Not on file  . Transportation needs    Medical: Not on file    Non-medical: Not on file  Tobacco Use  . Smoking status: Current Every Day Smoker    Packs/day: 0.75    Years: 27.00    Pack years: 20.25    Types: Cigarettes  .  Smokeless tobacco: Former NeurosurgeonUser    Types: Chew  Substance and Sexual Activity  . Alcohol use: Yes    Comment: 11/10/2014 "I've had 1 beer in the last 3 yrs"  . Drug use: No  . Sexual activity: Yes  Lifestyle  . Physical activity    Days per week: Not on file    Minutes per session: Not on file  . Stress: Not on file  Relationships  . Social Musicianconnections    Talks on phone: Not on file    Gets together: Not on file    Attends religious service: Not on file    Active member of club or organization: Not on file    Attends meetings of clubs or organizations: Not on file    Relationship status: Not on file  . Intimate partner violence    Fear of current or ex partner: Not on file    Emotionally abused: Not on file    Physically abused: Not on file    Forced sexual activity: Not on file  Other Topics Concern  . Not on file  Social History Narrative  . Not on file   Family History  Problem Relation Age of Onset  . Heart disease Mother   . Diabetes Mother   .  Heart attack Father   . Graves' disease Sister   . Hypothyroidism Brother   . Hypertension Maternal Aunt   . Diabetes Sister   . Heart disease Sister   . Heart disease Sister   . Heart disease Brother   . Diabetes Brother   . Arthritis Unknown     OBJECTIVE: Vitals:   10/02/18 1548  BP: 131/79  Pulse: 67  Resp: 18  Temp: 98 F (36.7 C)  SpO2: 94%    General appearance: alert; no distress Head: NCAT Lungs: Normal respiratory effort Heart: Radial pulse 2+ bilaterally Extremities: no edema Skin: warm and dry; 1- 2 cm cyst localized to lateral anterior wrist, mildly TTP, no obvious drainage or bleeding Psychological: alert and cooperative; normal mood and affect  ASSESSMENT & PLAN:  1. Ganglion cyst of volar aspect of right wrist   2. Right wrist pain     No orders of the defined types were placed in this encounter.  We will hold off on draining the cyst today due to the location and proximity to the  radial artery You may try wearing a brace Take ibuprofen and/or tylenol as needed for pain Follow up with orthopedist for further evaluation and management Return or go to the ER if you have any new or worsening symptoms such as fever, chills, nausea, vomiting, redness, swelling, discharge, if symptoms do not improve with treatment, etc...   Reviewed expectations re: course of current medical issues. Questions answered. Outlined signs and symptoms indicating need for more acute intervention. Patient verbalized understanding. After Visit Summary given.   Lestine Box, PA-C 10/02/18 1611

## 2018-11-25 ENCOUNTER — Encounter: Payer: Self-pay | Admitting: Orthopedic Surgery

## 2018-11-25 ENCOUNTER — Ambulatory Visit: Payer: PRIVATE HEALTH INSURANCE | Admitting: Orthopedic Surgery

## 2018-11-25 ENCOUNTER — Ambulatory Visit: Payer: PRIVATE HEALTH INSURANCE

## 2018-11-25 ENCOUNTER — Other Ambulatory Visit: Payer: Self-pay

## 2018-11-25 VITALS — BP 180/87 | HR 76 | Ht 73.0 in | Wt 290.0 lb

## 2018-11-25 DIAGNOSIS — M67431 Ganglion, right wrist: Secondary | ICD-10-CM | POA: Diagnosis not present

## 2018-11-25 NOTE — Progress Notes (Signed)
NEW PROBLEM//OFFICE VISIT  Chief Complaint  Patient presents with  . Wrist Problem    volar right wrist ? ganglion    53 right-hand-dominant presents with mildly  painful mass volar aspect right wrist x1 month associated with painful wrist flexion no trauma   Review of Systems  Skin: Negative.   Neurological: Negative for tingling.     Past Medical History:  Diagnosis Date  . Arthritis    "left shoulder" (11/10/2014)  . Joint pain   . OSA on CPAP     Past Surgical History:  Procedure Laterality Date  . APPENDECTOMY  ~ 2010  . JOINT REPLACEMENT    . KNEE ARTHROSCOPY Left ~ 2006  . TOTAL SHOULDER ARTHROPLASTY Left 11/10/2014  . TOTAL SHOULDER ARTHROPLASTY Left 11/10/2014   Procedure: TOTAL SHOULDER ARTHROPLASTY;  Surgeon: Jones Broom, MD;  Location: [facility] OR;  Service: Orthopedics;  Laterality: Left;  Left total shoulder arthroplasty    Family History  Problem Relation Age of Onset  . Heart disease Mother   . Diabetes Mother   . Heart attack Father   . Graves' disease Sister   . Hypothyroidism Brother   . Hypertension Maternal Aunt   . Diabetes Sister   . Heart disease Sister   . Heart disease Sister   . Heart disease Brother   . Diabetes Brother   . Arthritis Unknown    Social History   Tobacco Use  . Smoking status: Current Every Day Smoker    Packs/day: 0.75    Years: 27.00    Pack years: 20.25    Types: Cigarettes  . Smokeless tobacco: Former Neurosurgeon    Types: Chew  Substance Use Topics  . Alcohol use: Yes    Comment: 11/10/2014 "I've had 1 beer in the last 3 yrs"  . Drug use: No    No Known Allergies  No outpatient medications have been marked as taking for the 11/25/18 encounter (Office Visit) with Vickki Hearing, MD.    BP (!) 180/87   Pulse 76   Ht 6\' 1"  (1.854 m)   Wt 290 lb (131.5 kg)   BMI 38.26 kg/m   Physical Exam Vitals signs and nursing note reviewed.  Constitutional:      Appearance: Normal appearance.  Neurological:   Mental Status: He is alert and oriented to person, place, and time.  Psychiatric:        Mood and Affect: Mood normal.     Ortho Exam  Right wrist normal appearance normal range of motion Watson stability test normal strength normal skin normal  Left wrist tenderness and volar ganglion 2 x 2 volar aspect of the radial artery normal range of motion wrist joint stable strength normal skin normal neurovascular exam intact  MEDICAL DECISION SECTION  Imaging: Radiographs were done at our office today were negative The report has been reviewed.  I have made an independent evaluation and interpretation of the imaging: Normal wrist  Prior records including all forwarded office visits plus or minus emergency room visits have been reviewed and confirm the history given by the patient.   Encounter Diagnosis  Name Primary?  . Ganglion cyst of volar aspect of right wrist Yes    No orders of the defined types were placed in this encounter. Discussion of whether to take this mass off note patient had weak Allen's test for refill through the ulnar artery.  Made aware of possible injury to this artery if he decides to have this taken off he  wants to think about it he will call us back  Surgery would be excision volar ganglion right wrist  Arther Abbott, MD  11/25/2018 4:20 PM

## 2018-11-25 NOTE — Patient Instructions (Signed)
Ganglion Cyst  A ganglion cyst is a non-cancerous, fluid-filled lump that occurs near a joint or tendon. The cyst grows out of a joint or the lining of a tendon. Ganglion cysts most often develop in the hand or wrist, but they can also develop in the shoulder, elbow, hip, knee, ankle, or foot. Ganglion cysts are ball-shaped or egg-shaped. Their size can range from the size of a pea to larger than a grape. Increased activity may cause the cyst to get bigger because more fluid starts to build up. What are the causes? The exact cause of this condition is not known, but it may be related to:  Inflammation or irritation around the joint.  An injury.  Repetitive movements or overuse.  Arthritis. What increases the risk? You are more likely to develop this condition if:  You are a woman.  You are 15-40 years old. What are the signs or symptoms? The main symptom of this condition is a lump. It most often appears on the hand or wrist. In many cases, there are no other symptoms, but a cyst can sometimes cause:  Tingling.  Pain.  Numbness.  Muscle weakness.  Weak grip.  Less range of motion in a joint. How is this diagnosed? Ganglion cysts are usually diagnosed based on a physical exam. Your health care provider will feel the lump and may shine a light next to it. If it is a ganglion cyst, the light will likely shine through it. Your health care provider may order an X-ray, ultrasound, or MRI to rule out other conditions. How is this treated? Ganglion cysts often go away on their own without treatment. If you have pain or other symptoms, treatment may be needed. Treatment is also needed if the ganglion cyst limits your movement or if it gets infected. Treatment may include:  Wearing a brace or splint on your wrist or finger.  Taking anti-inflammatory medicine.  Having fluid drained from the lump with a needle (aspiration).  Getting a steroid injected into the joint.  Having  surgery to remove the ganglion cyst.  Placing a pad on your shoe or wearing shoes that will not rub against the cyst if it is on your foot. Follow these instructions at home:  Do not press on the ganglion cyst, poke it with a needle, or hit it.  Take over-the-counter and prescription medicines only as told by your health care provider.  If you have a brace or splint: ? Wear it as told by your health care provider. ? Remove it as told by your health care provider. Ask if you need to remove it when you take a shower or a bath.  Watch your ganglion cyst for any changes.  Keep all follow-up visits as told by your health care provider. This is important. Contact a health care provider if:  Your ganglion cyst becomes larger or more painful.  You have pus coming from the lump.  You have weakness or numbness in the affected area.  You have a fever or chills. Get help right away if:  You have a fever and have any of these in the cyst area: ? Increased redness. ? Red streaks. ? Swelling. Summary  A ganglion cyst is a non-cancerous, fluid-filled lump that occurs near a joint or tendon.  Ganglion cysts most often develop in the hand or wrist, but they can also develop in the shoulder, elbow, hip, knee, ankle, or foot.  Ganglion cysts often go away on their own without treatment.   This information is not intended to replace advice given to you by your health care provider. Make sure you discuss any questions you have with your health care provider. Document Released: 02/02/2000 Document Revised: 01/17/2017 Document Reviewed: 10/04/2016 Elsevier Patient Education  2020 [employer]  

## 2019-03-21 ENCOUNTER — Encounter (HOSPITAL_COMMUNITY): Payer: Self-pay | Admitting: Emergency Medicine

## 2019-03-21 ENCOUNTER — Other Ambulatory Visit: Payer: Self-pay

## 2019-03-21 ENCOUNTER — Emergency Department (HOSPITAL_COMMUNITY): Payer: No Typology Code available for payment source

## 2019-03-21 ENCOUNTER — Emergency Department (HOSPITAL_COMMUNITY)
Admission: EM | Admit: 2019-03-21 | Discharge: 2019-03-21 | Disposition: A | Discharge status: Home / Self Care | Payer: No Typology Code available for payment source | Attending: Emergency Medicine | Admitting: Emergency Medicine

## 2019-03-21 DIAGNOSIS — F17210 Nicotine dependence, cigarettes, uncomplicated: Secondary | ICD-10-CM | POA: Diagnosis not present

## 2019-03-21 DIAGNOSIS — N201 Calculus of ureter: Secondary | ICD-10-CM

## 2019-03-21 DIAGNOSIS — N390 Urinary tract infection, site not specified: Secondary | ICD-10-CM | POA: Insufficient documentation

## 2019-03-21 DIAGNOSIS — R1032 Left lower quadrant pain: Secondary | ICD-10-CM | POA: Diagnosis present

## 2019-03-21 DIAGNOSIS — Z9660 Presence of unspecified orthopedic joint implant: Secondary | ICD-10-CM | POA: Diagnosis not present

## 2019-03-21 DIAGNOSIS — R319 Hematuria, unspecified: Secondary | ICD-10-CM | POA: Diagnosis not present

## 2019-03-21 HISTORY — DX: Diverticulitis of intestine, part unspecified, without perforation or abscess without bleeding: K57.92

## 2019-03-21 LAB — URINALYSIS, ROUTINE W REFLEX MICROSCOPIC
Glucose, UA: NEGATIVE mg/dL
Ketones, ur: NEGATIVE mg/dL
Nitrite: NEGATIVE
Protein, ur: 100 mg/dL — AB
RBC / HPF: >50 RBC/hpf — ABNORMAL HIGH (ref 0–5)
Specific Gravity, Urine: 1.03 (ref 1.005–1.030)
pH: 5 (ref 5.0–8.0)

## 2019-03-21 LAB — CBC
HCT: 45.7 % (ref 39.0–52.0)
Hemoglobin: 15.8 g/dL (ref 13.0–17.0)
MCH: 31.9 pg (ref 26.0–34.0)
MCHC: 34.6 g/dL (ref 30.0–36.0)
MCV: 92.3 fL (ref 80.0–100.0)
Platelets: 233 10*3/uL (ref 150–400)
RBC: 4.95 MIL/uL (ref 4.22–5.81)
RDW: 12.8 % (ref 11.5–15.5)
WBC: 9.2 10*3/uL (ref 4.0–10.5)
nRBC: 0 % (ref 0.0–0.2)

## 2019-03-21 LAB — COMPREHENSIVE METABOLIC PANEL
ALT: 42 U/L (ref 0–44)
AST: 29 U/L (ref 15–41)
Albumin: 4.2 g/dL (ref 3.5–5.0)
Alkaline Phosphatase: 61 U/L (ref 38–126)
Anion gap: 9 (ref 5–15)
BUN: 13 mg/dL (ref 6–20)
CO2: 25 mmol/L (ref 22–32)
Calcium: 9.1 mg/dL (ref 8.9–10.3)
Chloride: 106 mmol/L (ref 98–111)
Creatinine, Ser: 1.05 mg/dL (ref 0.61–1.24)
GFR calc Af Amer: >60 mL/min (ref >60)
GFR calc non Af Amer: >60 mL/min (ref >60)
Glucose, Bld: 95 mg/dL (ref 70–99)
Potassium: 4 mmol/L (ref 3.5–5.1)
Sodium: 140 mmol/L (ref 135–145)
Total Bilirubin: 0.6 mg/dL (ref 0.3–1.2)
Total Protein: 7.1 g/dL (ref 6.5–8.1)

## 2019-03-21 LAB — LIPASE, BLOOD: Lipase: 35 U/L (ref 11–51)

## 2019-03-21 MED ORDER — KETOROLAC TROMETHAMINE 30 MG/ML IJ SOLN
30.0000 mg | Freq: Once | INTRAMUSCULAR | Status: AC
  Administered 2019-03-21: 30 mg via INTRAVENOUS
  Filled 2019-03-21: qty 1

## 2019-03-21 MED ORDER — ONDANSETRON HCL 4 MG/2ML IJ SOLN
4.0000 mg | Freq: Once | INTRAMUSCULAR | Status: AC
  Administered 2019-03-21: 14:00:00 4 mg via INTRAVENOUS
  Filled 2019-03-21: qty 2

## 2019-03-21 MED ORDER — SODIUM CHLORIDE 0.9 % IV SOLN
1.0000 g | Freq: Once | INTRAVENOUS | Status: AC
  Administered 2019-03-21: 1 g via INTRAVENOUS
  Filled 2019-03-21: qty 10

## 2019-03-21 MED ORDER — TAMSULOSIN HCL 0.4 MG PO CAPS: 0.4000 mg | ORAL_CAPSULE | Freq: Every day | ORAL | 0 refills | Status: AC

## 2019-03-21 MED ORDER — CEPHALEXIN 500 MG PO CAPS: 500.0000 mg | ORAL_CAPSULE | Freq: Four times a day (QID) | ORAL | 0 refills | Status: AC

## 2019-03-21 MED ORDER — OXYCODONE-ACETAMINOPHEN 5-325 MG PO TABS: 1.0000 | ORAL_TABLET | ORAL | 0 refills | Status: AC | PRN

## 2019-03-21 MED ORDER — HYDROMORPHONE HCL 1 MG/ML IJ SOLN
1.0000 mg | Freq: Once | INTRAMUSCULAR | Status: AC
  Administered 2019-03-21: 1 mg via INTRAVENOUS
  Filled 2019-03-21: qty 1

## 2019-03-21 NOTE — Discharge Instructions (Signed)
Your CT scan today shows that you have a 4 mm left-sided kidney stone and your urine test shows an infection.  You will need to take the antibiotics as directed until they are finished.  Call [facility] as listed tomorrow morning to arrange a follow-up appointment.  Return to the emergency department if you develop any worsening symptoms such as increasing pain, fever, or persistent vomiting.

## 2019-03-21 NOTE — ED Provider Notes (Signed)
[facility] EMERGENCY DEPARTMENT Provider Note   CSN: [id number] Arrival date & time: 03/21/19  1333     History Chief Complaint  Patient presents with  . Abdominal Pain    Paul Wright is a 54 y.o. male.  HPI     Paul Wright is a 54 y.o. male with past medical history of diverticulitis with her history of perforation that required ileostomy.  ileostomy was reversed 1 year ago.  Who presents to the Emergency Department complaining of sudden onset of left lower quadrant pain earlier today.  Pain began approximately 2 hours ago.  He describes the pain is severe and nonradiating, but endorses low back pain for 2 weeks.  He also endorses having urinary hesitancy today with dark, malodorous urine.  He denies known hematuria.  He states the pain feels similar to his previous bouts of diverticulitis.  His symptoms have been associated with nausea, but no vomiting.  He denies fever, chills, chest pain, shortness of breath, and pain to his groin or testicles.  No penile discharge or swelling. No hx of kidney stones    Past Medical History:  Diagnosis Date  . Arthritis    "left shoulder" (11/10/2014)  . Diverticulitis   . Joint pain   . OSA on CPAP     Patient Active Problem List   Diagnosis Date Noted  . 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 History of colon surgery 06/10/2017  . Diverticulosis of large intestine without hemorrhage 03/18/2017  . History of colonic polyps 03/18/2017  . Diverticulitis of colon 11/06/2016  . Appendicitis 10/10/2016  . Diverticulitis of large intestine with perforation without abscess or bleeding 10/10/2016  . Glenohumeral arthritis 11/10/2014  . Unspecified hereditary and idiopathic peripheral neuropathy 11/12/2013  . Obstructive sleep apnea 11/12/2013  . OA (osteoarthritis) of knee 08/27/2012  . Left knee pain 08/27/2012  . Medial meniscus, posterior horn derangement 09/05/2011  . Effusion of knee joint 09/05/2011    Past Surgical  History:  Procedure Laterality Date  . APPENDECTOMY  ~ 2010  . JOINT REPLACEMENT    . KNEE ARTHROSCOPY Left ~ 2006  . TOTAL SHOULDER ARTHROPLASTY Left 11/10/2014  . TOTAL SHOULDER ARTHROPLASTY Left 11/10/2014   Procedure: TOTAL SHOULDER ARTHROPLASTY;  Surgeon: Jones Broom, MD;  Location: [facility] OR;  Service: Orthopedics;  Laterality: Left;  Left total shoulder arthroplasty       Family History  Problem Relation Age of Onset  . Heart disease Mother   . Diabetes Mother   . Heart attack Father   . Graves' disease Sister   . Hypothyroidism Brother   . Hypertension Maternal Aunt   . Diabetes Sister   . Heart disease Sister   . Heart disease Sister   . Heart disease Brother   . Diabetes Brother   . Arthritis Other     Social History   Tobacco Use  . Smoking status: Current Every Day Smoker    Packs/day: 0.75    Years: 27.00    Pack years: 20.25    Types: Cigarettes  . Smokeless tobacco: Former Neurosurgeon    Types: Chew  Substance Use Topics  . Alcohol use: Yes    Comment: 11/10/2014 "I've had 1 beer in the last 3 yrs"  . Drug use: No    Home Medications Prior to Admission medications   Not on File    Allergies    Patient has no known allergies.  Review of Systems   Review of Systems  Constitutional:  Negative for appetite change, chills and fever.  Respiratory: Negative for chest tightness and shortness of breath.   Cardiovascular: Negative for chest pain.  Gastrointestinal: Positive for abdominal pain. Negative for blood in stool, nausea and vomiting.  Genitourinary: Positive for difficulty urinating. Negative for decreased urine volume, dysuria, flank pain, hematuria, penile pain, penile swelling, scrotal swelling and testicular pain.  Musculoskeletal: Positive for back pain. Negative for neck pain.  Skin: Negative for color change and rash.  Neurological: Negative for dizziness, weakness and numbness.  Hematological: Negative for adenopathy.    Physical  Exam Updated Vital Signs BP 135/85   Pulse 80   Temp 97.8 F (36.6 C) (Oral)   Resp 16   Ht 6\' 5"  (1.956 m)   Wt 133.8 kg   SpO2 97%   BMI 34.98 kg/m   Physical Exam Vitals and nursing note reviewed.  Constitutional:      Appearance: He is well-developed. He is not toxic-appearing.     Comments: Pt is uncomfortable appearing  HENT:     Mouth/Throat:     Mouth: Mucous membranes are moist.  Cardiovascular:     Rate and Rhythm: Normal rate and regular rhythm.     Pulses: Normal pulses.  Pulmonary:     Effort: Pulmonary effort is normal.     Breath sounds: Normal breath sounds.  Chest:     Chest wall: No tenderness.  Abdominal:     General: There is no distension.     Palpations: Abdomen is soft.     Tenderness: There is abdominal tenderness in the left lower quadrant. There is no right CVA tenderness or left CVA tenderness.     Comments: ttp of the LLQ and left groin.  No guarding or rebound tenderness.  Well healed surgical scar of midline and LLQ.  No erythema  Musculoskeletal:        General: Normal range of motion.     Cervical back: Normal range of motion.     Right lower leg: No edema.     Left lower leg: No edema.  Skin:    General: Skin is warm.     Capillary Refill: Capillary refill takes less than 2 seconds.     Findings: No erythema or rash.  Neurological:     General: No focal deficit present.     Mental Status: He is alert.     Sensory: No sensory deficit.     Motor: No weakness.     ED Results / Procedures / Treatments   Labs (all labs ordered are listed, but only abnormal results are displayed) Labs Reviewed  URINALYSIS, ROUTINE W REFLEX MICROSCOPIC - Abnormal; Notable for the following components:      Result Value   Color, Urine AMBER (*)    APPearance CLOUDY (*)    Hgb urine dipstick LARGE (*)    Bilirubin Urine SMALL (*)    Protein, ur 100 (*)    Leukocytes,Ua SMALL (*)    RBC / HPF >50 (*)    Bacteria, UA MANY (*)    All other  components within normal limits  URINE CULTURE  LIPASE, BLOOD  COMPREHENSIVE METABOLIC PANEL  CBC    EKG None  Radiology CT Renal Stone Study  Result Date: 03/21/2019 CLINICAL DATA:  Left flank pain. Hematuria. Beginning earlier today. History of diverticulitis. Partial colectomy. Colostomy reversal. Appendectomy. EXAM: CT ABDOMEN AND PELVIS WITHOUT CONTRAST TECHNIQUE: Multidetector CT imaging of the abdomen and pelvis was performed following the standard protocol  without IV contrast. COMPARISON:  12/03/2016 from [facility] hospital, report only. FINDINGS: Lower chest: Emphysema. Bibasilar atelectasis. Normal heart size without pericardial or pleural effusion. Right coronary artery atherosclerosis. Hepatobiliary: 2.0 cm left hepatic lobe cyst or minimally complex cyst. Moderate hepatic steatosis. Normal gallbladder, without biliary ductal dilatation. Pancreas: Normal, without mass or ductal dilatation. Spleen: Normal in size, without focal abnormality. Adrenals/Urinary Tract: Normal adrenal glands. 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 Interpolar left renal 1.5 cm low-density lesion is likely a cyst. At least 1 punctate left renal collecting system calculus. Mild left-sided hydroureteronephrosis, which continues to the level of a 4 mm stone at the left ureterovesicular junction on 90/2 Stomach/Bowel: Normal stomach, without wall thickening. Rectal underdistention. Surgical sutures at the rectosigmoid junction. Appendectomy. Normal terminal ileum. Surgical sutures in the distal ileum with mild dilatation, likely due to postoperative atony. Vascular/Lymphatic: Aortic atherosclerosis. No abdominopelvic adenopathy. Reproductive: Normal prostate. Other: No significant free fluid. Musculoskeletal: No acute osseous abnormality. IMPRESSION: 1. Mild left-sided hydroureteronephrosis secondary to a 4 mm stone at the left ureterovesicular junction. 2.  Left nephrolithiasis. 3. Hepatic steatosis. 4. Aortic atherosclerosis (ICD10-I70.0)and emphysema (ICD10-J43.9). 5. Age advanced coronary artery atherosclerosis. Recommend assessment of coronary risk factors and consideration of medical therapy. Electronically Signed   By: Jeronimo Greaves M.D.   On: 03/21/2019 15:52    Procedures Procedures (including critical care time)  Medications Ordered in ED Medications  HYDROmorphone (DILAUDID) injection 1 mg (1 mg Intravenous Given 03/21/19 1422)  ondansetron (ZOFRAN) injection 4 mg (4 mg Intravenous Given 03/21/19 1422)  ketorolac (TORADOL) 30 MG/ML injection 30 mg (30 mg Intravenous Given 03/21/19 1737)  cefTRIAXone (ROCEPHIN) 1 g in sodium chloride 0.9 % 100 mL IVPB (0 g Intravenous Stopped 03/21/19 1806)    ED Course  I have reviewed the triage vital signs and the nursing notes.  Pertinent labs & imaging results that were available during my care of the patient were reviewed by me and considered in my medical decision making (see chart for details).    MDM Rules/Calculators/A&P                      Patient with sudden onset of left lower quadrant pain, dysuria with urinary hesitancy and malodorous urine.  No history of kidney stones.  Vital signs are reassuring.  He is nontoxic.  Given history of diverticulitis and current symptoms that are concerning for possible kidney stone, will proceed with labs and urinalysis.  If hematuria is present, will likely order renal stone study.  On recheck, pt reports that pain has improved after medication.  His urinalysis shows likely infection, so CT renal stone study was ordered.  Remaining labs are reassuring. Urine culture ordered.  He is well appearing and non-toxic appearing  1620  Consulted urology and discussed findings with Dr. Liliane Shi who recommends antibiotics and close f/u with urology this week.  Pt agrees to plan and return precautions given  Final Clinical Impression(s) / ED Diagnoses Final  diagnoses:  Left ureteral stone  Urinary tract infection with hematuria, site unspecified    Rx / DC Orders ED Discharge Orders         Ordered    oxyCODONE-acetaminophen (PERCOCET/ROXICET) 5-325 MG tablet  Every 4 hours PRN     03/21/19 1654    tamsulosin (FLOMAX) 0.4 MG CAPS capsule  Daily     03/21/19 1654    cephALEXin (KEFLEX) 500 MG capsule  4 times daily  03/21/19 [place], PA-C 03/22/19 [place], MD 03/22/19 [phone]

## 2019-03-21 NOTE — ED Triage Notes (Signed)
Patient states he has LLQ abdominal pain that began around noon. Patient has a history of diverticulitis.

## 2019-03-22 LAB — URINE CULTURE: Culture: <10000 — AB

## 2020-02-22 ENCOUNTER — Ambulatory Visit (HOSPITAL_COMMUNITY)
Admission: RE | Admit: 2020-02-22 | Discharge: 2020-02-22 | Disposition: A | Discharge status: Home / Self Care | Payer: PRIVATE HEALTH INSURANCE | Source: Ambulatory Visit | Attending: Cardiovascular Disease | Admitting: Cardiovascular Disease

## 2020-02-22 ENCOUNTER — Other Ambulatory Visit (HOSPITAL_COMMUNITY): Payer: Self-pay | Admitting: Orthopedic Surgery

## 2020-02-22 ENCOUNTER — Other Ambulatory Visit: Payer: Self-pay

## 2020-02-22 DIAGNOSIS — M7989 Other specified soft tissue disorders: Secondary | ICD-10-CM | POA: Insufficient documentation

## 2020-02-22 DIAGNOSIS — M79662 Pain in left lower leg: Secondary | ICD-10-CM

## 2021-09-09 ENCOUNTER — Encounter (HOSPITAL_BASED_OUTPATIENT_CLINIC_OR_DEPARTMENT_OTHER): Payer: Self-pay | Admitting: Obstetrics and Gynecology

## 2021-09-09 ENCOUNTER — Emergency Department (HOSPITAL_BASED_OUTPATIENT_CLINIC_OR_DEPARTMENT_OTHER): Payer: No Typology Code available for payment source

## 2021-09-09 ENCOUNTER — Emergency Department (HOSPITAL_BASED_OUTPATIENT_CLINIC_OR_DEPARTMENT_OTHER)
Admission: EM | Admit: 2021-09-09 | Discharge: 2021-09-09 | Disposition: A | Discharge status: Home / Self Care | Payer: No Typology Code available for payment source | Attending: Emergency Medicine | Admitting: Emergency Medicine

## 2021-09-09 ENCOUNTER — Other Ambulatory Visit: Payer: Self-pay

## 2021-09-09 DIAGNOSIS — R109 Unspecified abdominal pain: Secondary | ICD-10-CM | POA: Diagnosis present

## 2021-09-09 LAB — CBC
HCT: 43.4 % (ref 39.0–52.0)
Hemoglobin: 15.6 g/dL (ref 13.0–17.0)
MCH: 32.2 pg (ref 26.0–34.0)
MCHC: 35.9 g/dL (ref 30.0–36.0)
MCV: 89.5 fL (ref 80.0–100.0)
Platelets: 220 10*3/uL (ref 150–400)
RBC: 4.85 MIL/uL (ref 4.22–5.81)
RDW: 12.9 % (ref 11.5–15.5)
WBC: 8.8 10*3/uL (ref 4.0–10.5)
nRBC: 0 % (ref 0.0–0.2)

## 2021-09-09 LAB — URINALYSIS, ROUTINE W REFLEX MICROSCOPIC
Bilirubin Urine: NEGATIVE
Glucose, UA: NEGATIVE mg/dL
Hgb urine dipstick: NEGATIVE
Ketones, ur: NEGATIVE mg/dL
Leukocytes,Ua: NEGATIVE
Nitrite: NEGATIVE
Specific Gravity, Urine: 1.028 (ref 1.005–1.030)
pH: 5.5 (ref 5.0–8.0)

## 2021-09-09 LAB — BASIC METABOLIC PANEL
Anion gap: 11 (ref 5–15)
BUN: 12 mg/dL (ref 6–20)
CO2: 24 mmol/L (ref 22–32)
Calcium: 9.8 mg/dL (ref 8.9–10.3)
Chloride: 105 mmol/L (ref 98–111)
Creatinine, Ser: 0.82 mg/dL (ref 0.61–1.24)
GFR, Estimated: >60 mL/min (ref >60)
Glucose, Bld: 95 mg/dL (ref 70–99)
Potassium: 3.9 mmol/L (ref 3.5–5.1)
Sodium: 140 mmol/L (ref 135–145)

## 2021-09-09 MED ORDER — OXYCODONE-ACETAMINOPHEN 5-325 MG PO TABS
2.0000 | ORAL_TABLET | Freq: Once | ORAL | Status: AC
  Administered 2021-09-09: 2 via ORAL
  Filled 2021-09-09: qty 2

## 2021-09-09 MED ORDER — KETOROLAC TROMETHAMINE 30 MG/ML IJ SOLN: 30.0000 mg | Freq: Once | INTRAMUSCULAR | Status: DC

## 2021-09-09 MED ORDER — HYDROMORPHONE HCL 1 MG/ML IJ SOLN: 1.0000 mg | Freq: Once | INTRAMUSCULAR | Status: DC

## 2021-09-09 MED ORDER — HYDROCODONE-ACETAMINOPHEN 5-325 MG PO TABS
1.0000 | ORAL_TABLET | ORAL | 0 refills | Status: AC | PRN
Start: 2021-09-09 — End: 2022-09-09

## 2021-09-09 MED ORDER — METHOCARBAMOL 500 MG PO TABS: 500.0000 mg | ORAL_TABLET | Freq: Four times a day (QID) | ORAL | 0 refills | Status: AC

## 2021-09-09 NOTE — ED Provider Notes (Signed)
[facility] EMERGENCY DEPT Provider Note   CSN: [id number] Arrival date & time: 09/09/21  1452     History  Chief Complaint  Patient presents with   Flank Pain    Paul Wright is a 56 y.o. male.  Pt reports he has been having pain in his back on and off   The history is provided by the patient.  Flank Pain This is a new problem. The current episode started more than 1 week ago. The problem occurs constantly. The problem has not changed since onset.Pertinent negatives include no abdominal pain. Nothing aggravates the symptoms. Nothing relieves the symptoms. He has tried nothing for the symptoms. The treatment provided no relief.       Home Medications Prior to Admission medications   Medication Sig Start Date End Date Taking? Authorizing Provider  HYDROcodone-acetaminophen (NORCO/VICODIN) 5-325 MG tablet Take 1 tablet by mouth every 4 (four) hours as needed for moderate pain. 09/09/21 09/09/22 Yes Elson Areas, PA-C  methocarbamol (ROBAXIN) 500 MG tablet Take 1 tablet (500 mg total) by mouth 4 (four) times daily. 09/09/21  Yes Cheron Schaumann K, PA-C  cephALEXin (KEFLEX) 500 MG capsule Take 1 capsule (500 mg total) by mouth 4 (four) times daily. 03/21/19   Triplett, Tammy, PA-C  oxyCODONE-acetaminophen (PERCOCET/ROXICET) 5-325 MG tablet Take 1 tablet by mouth every 4 (four) hours as needed. 03/21/19   Triplett, Tammy, PA-C  tamsulosin (FLOMAX) 0.4 MG CAPS capsule Take 1 capsule (0.4 mg total) by mouth daily. 03/21/19   Triplett, Babette Relic, PA-C      Allergies    Patient has no known allergies.    Review of Systems   Review of Systems  Gastrointestinal:  Negative for abdominal pain.  Genitourinary:  Positive for flank pain.  All other systems reviewed and are negative.   Physical Exam Updated Vital Signs BP 126/89 (BP Location: Right Arm)   Pulse 72   Temp 98.9 F (37.2 C) (Oral)   Resp 18   Ht 6\' 5"  (1.956 m)   Wt 135.6 kg   SpO2 96%   BMI 35.46 kg/m   Physical Exam Vitals and nursing note reviewed.  Constitutional:      Appearance: He is well-developed.  HENT:     Head: Normocephalic.  Cardiovascular:     Rate and Rhythm: Normal rate.  Pulmonary:     Effort: Pulmonary effort is normal.  Abdominal:     General: Abdomen is flat. There is no distension.     Palpations: Abdomen is soft.     Tenderness: There is no abdominal tenderness.  Musculoskeletal:        General: Normal range of motion.     Cervical back: Normal range of motion.  Skin:    General: Skin is warm.  Neurological:     General: No focal deficit present.     Mental Status: He is alert and oriented to person, place, and time.  Psychiatric:        Mood and Affect: Mood normal.     ED Results / Procedures / Treatments   Labs (all labs ordered are listed, but only abnormal results are displayed) Labs Reviewed  URINALYSIS, ROUTINE W REFLEX MICROSCOPIC - Abnormal; Notable for the following components:      Result Value   Protein, ur TRACE (*)    All other components within normal limits  BASIC METABOLIC PANEL  CBC    EKG None  Radiology CT Renal Stone Study  Result Date: 09/09/2021 CLINICAL  DATA:  Right lower flank pain beginning months ago. Pain is worsened. Dark colored urine. EXAM: CT ABDOMEN AND PELVIS WITHOUT CONTRAST TECHNIQUE: Multidetector CT imaging of the abdomen and pelvis was performed following the standard protocol without IV contrast. RADIATION DOSE REDUCTION: This exam was performed according to the departmental dose-optimization program which includes automated exposure control, adjustment of the mA and/or kV according to patient size and/or use of iterative reconstruction technique. COMPARISON:  03/21/2019. FINDINGS: Lower chest: Clear lung bases. Hepatobiliary: Diffuse decreased attenuation of the liver consistent with fatty infiltration. Hypoattenuating lesion in segment 2, 2.4 cm in size, stable from the prior CT consistent with a benign  etiology. No other liver mass or lesion. Normal gallbladder. No bile duct dilation. Pancreas: Unremarkable. No pancreatic ductal dilatation or surrounding inflammatory changes. Spleen: Normal in size without focal abnormality. Adrenals/Urinary Tract: No adrenal masses. Kidneys normal in overall size and position. Mild left renal cortical thinning. Small, left midpole stone that may be vascular or in the collecting system. Low-attenuation exophytic 1.4 cm mass, anterior midpole the left kidney consistent with a cyst, stable from the prior study. No other renal masses. No hydronephrosis. Normal ureters. Bladder mostly decompressed, otherwise unremarkable. Stomach/Bowel: Normal stomach. Small bowel and colon are normal in caliber. No wall thickening. No inflammation. Distal small bowel anastomosis. Low sigmoid colon anastomosis. These findings are stable. Previous appendectomy. Vascular/Lymphatic: Aortic atherosclerosis. No aneurysm. No enlarged lymph nodes. Reproductive: Unremarkable. Other: No abdominal wall hernia or abnormality. No abdominopelvic ascites. Musculoskeletal: No fracture or acute finding.  No bone lesion. IMPRESSION: 1. No acute findings within the abdomen or pelvis. No ureteral stones or obstructive uropathy. 2. Small nonobstructing stone versus vascular calcification, midpole of the left kidney. 3. Hepatic steatosis. 4. Aortic atherosclerosis. 5. Stable changes from prior bowel surgery. Electronically Signed   By: Amie Portland M.D.   On: 09/09/2021 16:29    Procedures Procedures    Medications Ordered in ED Medications  oxyCODONE-acetaminophen (PERCOCET/ROXICET) 5-325 MG per tablet 2 tablet (2 tablets Oral Given 09/09/21 1613)    ED Course/ Medical Decision Making/ A&P                           Medical Decision Making Pt complains of pain in his low back on and off for the past month.  Pt thinks he may have a kidney stone.    Amount and/or Complexity of Data Reviewed Independent  Historian: spouse External Data Reviewed: notes.    Details: Surery notes reviewed Labs: ordered. Decision-making details documented in ED Course.    Details: Ua  no blood, cbc and bmet normal Radiology: ordered and independent interpretation performed. Decision-making details documented in ED Course.    Details: Ct shows no evidence of stone,  Risk Prescription drug management. Risk Details: Pt given rx for robaxin and hydrocodone.  Pt advised to follow up with his MD for recheck this week.            Final Clinical Impression(s) / ED Diagnoses Final diagnoses:  Right flank pain    Rx / DC Orders ED Discharge Orders          Ordered    methocarbamol (ROBAXIN) 500 MG tablet  4 times daily        09/09/21 1713    HYDROcodone-acetaminophen (NORCO/VICODIN) 5-325 MG tablet  Every 4 hours PRN        09/09/21 1713  An After Visit Summary was printed and given to the patient.    Elson Areas, Cordelia Poche 09/09/21 1733    Gwyneth Sprout, MD 09/09/21 [phone]

## 2021-09-09 NOTE — ED Triage Notes (Signed)
Patient reports to the ER for right lower flank pain that started months ago and has recently gotten worse. Patient reports dark colored urine. Hx of kidney stones

## 2021-09-09 NOTE — ED Notes (Signed)
Discharge paperwork given and understood. 

## 2021-09-09 NOTE — Discharge Instructions (Addendum)
See your Physician for recheck in 2-3 days.  Watch for developing rash that would indicate shingles.

## 2023-02-20 ENCOUNTER — Other Ambulatory Visit: Payer: Self-pay | Admitting: Family Medicine

## 2023-02-20 DIAGNOSIS — Z9049 Acquired absence of other specified parts of digestive tract: Secondary | ICD-10-CM

## 2023-02-20 DIAGNOSIS — R198 Other specified symptoms and signs involving the digestive system and abdomen: Secondary | ICD-10-CM

## 2023-02-20 DIAGNOSIS — R9389 Abnormal findings on diagnostic imaging of other specified body structures: Secondary | ICD-10-CM

## 2023-02-27 ENCOUNTER — Ambulatory Visit
Admission: RE | Admit: 2023-02-27 | Discharge: 2023-02-27 | Disposition: A | Discharge status: Home / Self Care | Payer: No Typology Code available for payment source | Source: Ambulatory Visit | Attending: Family Medicine | Admitting: Family Medicine

## 2023-02-27 DIAGNOSIS — Z9049 Acquired absence of other specified parts of digestive tract: Secondary | ICD-10-CM

## 2023-02-27 DIAGNOSIS — R9389 Abnormal findings on diagnostic imaging of other specified body structures: Secondary | ICD-10-CM

## 2023-02-27 DIAGNOSIS — R198 Other specified symptoms and signs involving the digestive system and abdomen: Secondary | ICD-10-CM

## 2023-02-27 MED ORDER — IOPAMIDOL (ISOVUE-300) INJECTION 61%
100.0000 mL | Freq: Once | INTRAVENOUS | Status: AC | PRN
  Administered 2023-02-27: 100 mL via INTRAVENOUS
